# Patient Record
Sex: Female | Born: 1955
Health system: Southern US, Community
[De-identification: ages and names within clinical notes are randomized; demographics above are authoritative.]

## PROBLEM LIST (undated history)

## (undated) DIAGNOSIS — K743 Primary biliary cirrhosis: Secondary | ICD-10-CM

## (undated) DIAGNOSIS — M1990 Unspecified osteoarthritis, unspecified site: Secondary | ICD-10-CM

## (undated) DIAGNOSIS — I10 Essential (primary) hypertension: Secondary | ICD-10-CM

## (undated) DIAGNOSIS — IMO0002 Reserved for concepts with insufficient information to code with codable children: Secondary | ICD-10-CM

## (undated) HISTORY — DX: Unspecified osteoarthritis, unspecified site: M19.90

## (undated) HISTORY — DX: Reserved for concepts with insufficient information to code with codable children: IMO0002

---

## 1976-12-09 HISTORY — PX: DILATION AND CURETTAGE OF UTERUS: SHX78

## 1993-12-09 HISTORY — PX: TUBAL LIGATION: SHX77

## 2000-12-09 HISTORY — PX: OTHER SURGICAL HISTORY: SHX169

## 2001-12-09 DIAGNOSIS — K743 Primary biliary cirrhosis: Secondary | ICD-10-CM

## 2001-12-09 HISTORY — DX: Primary biliary cirrhosis: K74.3

## 2001-12-09 HISTORY — PX: LIVER BIOPSY: SHX301

## 2003-12-10 HISTORY — PX: ENDOMETRIAL FULGURATION: SHX1500

## 2005-04-18 ENCOUNTER — Ambulatory Visit: Payer: Self-pay | Admitting: Internal Medicine

## 2006-04-16 ENCOUNTER — Ambulatory Visit: Payer: Self-pay | Admitting: Internal Medicine

## 2007-04-23 ENCOUNTER — Ambulatory Visit: Payer: Self-pay | Admitting: Internal Medicine

## 2009-02-15 ENCOUNTER — Encounter: Admission: RE | Admit: 2009-02-15 | Discharge: 2009-02-15 | Payer: Self-pay | Admitting: Specialist

## 2011-04-23 NOTE — Assessment & Plan Note (Signed)
[name], [name]                   CHART#:  [id number]   DATE:  04/23/2007                       DOB:  06-09-1956   PRESENTING COMPLAINT:  Follow-up for liver disease secondary to PBC and  NASH.   SUBJECTIVE:  Kathryn Ramos is a 55 year old Caucasian female with biopsy-proven,  AMA positive PBC and NASH, diagnosed in March 2003, who is here for a  yearly visit.  She has no complaints.  She remains with a very good  appetite.  She does try to exercise as often as she is able to.  She  states she has a lot of stress originating from her work.  Sometimes she  works 6 days a week.  She has gained 6 pounds since her last visit.  Her  weight is about the same it was at the time of diagnosis.  She states  she has a very good appetite.  She denies abdominal pain, pruritus,  weakness or fatigue.  Bowels move regularly.  No history of melena or  rectal bleeding.   She had an endometrial polyp removed in January 2001, which was negative  for malignancy.  Endometrial polyp removed in January 2008 which was  benign.   She is on Dyazide 1-2 doses a week, MVI daily, Tylenol p.r.n. but no  more than 2 grams a week, Actigall 600 mg b.i.d., calcium with D 600 mg  b.i.d.   OBJECTIVE:  VITAL SIGNS:  Weight 224-1/2 pounds.  She is 5 feet 6 inches  tall, pulse 88 per minute, blood pressure is 130/80, temperature 98.4.  HEENT:  Conjunctivae pink.  Sclerae are nonicteric.  Oropharyngeal  mucosa is normal.  NECK:  No neck masses are noted.  ABDOMEN:  Obese, soft and nontender.  Spleen is not palpable.  The liver  edge is distant, below RCM.  It does not appear to be enlarged.  EXTREMITIES:  She does not have peripheral edema.   LABS:  From 04/16/2007:  WBC 6.0, H & H is 13.9 and 40.2, platelet count  is 299K.  Bilirubin 0.8, AP 184, AST 43, ALT 49, albumin 3.8.  Her  transaminases on 11/03/2006 were AST of 39 and ALT of 44.   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  Her liver biopsy  was in March 2003 and negative for cirrhosis.  Her transaminases have  gone up slightly in the last 1 year.  I suspect this is related to  weight gain.  She is on 1200 mg of Actigall and there is room for the  dose to be bumped up to 1500.  However, I would like to see what happens  when her numbers are checked in 6 months.   PLAN:  1. She will continue Actigall at 600 mg b.i.d.  2. The patient is encouraged to exercise regularly if possible      everyday.  3. She will have LFTs repeated in 6 months.  4. She will have bone density scheduled through Dr. Marcell Anger office.      Her last study was normal in June 2003.  5. She will return for OV in 1 year.  She will need screening for CRC      in 2012 since her last exam was in 2002.  Hildred Laser, M.D.  Electronically Signed     NR/MEDQ  D:  04/23/2007  T:  04/23/2007  Job:  [id number]   cc:   Carin Primrose

## 2011-05-21 ENCOUNTER — Ambulatory Visit (INDEPENDENT_AMBULATORY_CARE_PROVIDER_SITE_OTHER): Payer: Self-pay | Admitting: Internal Medicine

## 2011-05-28 ENCOUNTER — Ambulatory Visit (INDEPENDENT_AMBULATORY_CARE_PROVIDER_SITE_OTHER): Payer: BC Managed Care – PPO | Admitting: Internal Medicine

## 2011-05-28 DIAGNOSIS — K745 Biliary cirrhosis, unspecified: Secondary | ICD-10-CM

## 2012-01-20 ENCOUNTER — Other Ambulatory Visit (INDEPENDENT_AMBULATORY_CARE_PROVIDER_SITE_OTHER): Payer: Self-pay | Admitting: *Deleted

## 2012-01-20 ENCOUNTER — Telehealth (INDEPENDENT_AMBULATORY_CARE_PROVIDER_SITE_OTHER): Payer: Self-pay | Admitting: *Deleted

## 2012-01-20 DIAGNOSIS — Z1211 Encounter for screening for malignant neoplasm of colon: Secondary | ICD-10-CM

## 2012-01-20 NOTE — Telephone Encounter (Signed)
Patient needs movi prep 

## 2012-01-21 MED ORDER — PEG-KCL-NACL-NASULF-NA ASC-C 100 G PO SOLR: 1.0000 | Freq: Once | ORAL | Status: DC

## 2012-02-25 ENCOUNTER — Telehealth (INDEPENDENT_AMBULATORY_CARE_PROVIDER_SITE_OTHER): Payer: Self-pay | Admitting: *Deleted

## 2012-02-25 NOTE — Telephone Encounter (Signed)
Requesting MD/PCP:  vyas     Name & DOB: Kathryn Ramos 12/20/1955     Procedure: tcs   Reason/Indication:  screening  Has patient had this procedure before?  yes  If so, when, by whom and where?  2002  Is there a family history of colon cancer?  no  Who?  What age when diagnosed?    Is patient diabetic?   no      Does patient have prosthetic heart valve?  no  Do you have a pacemaker?  no  Has patient had joint replacement within last 12 months?  no  Is patient on Coumadin, Plavix and/or Aspirin? yes  Medications: asa 81 mg daily, biotin daily, urso 600 mg bid, acetaminophen 500 mg prn, multi vit, calcium + D 600 mg bid, topamax 75 mg daily, fish oil 1200 mg daily, fiber caplets daily  Allergies: the "cillins"  Pharmacy: [facility]  Medication Adjustment: asa 2 days before  Procedure date & time: 03/18/12 at 930

## 2012-02-27 NOTE — Telephone Encounter (Signed)
agree

## 2012-03-17 MED ORDER — SODIUM CHLORIDE 0.45 % IV SOLN
Freq: Once | INTRAVENOUS | Status: AC
  Administered 2012-03-18: 09:00:00 via INTRAVENOUS

## 2012-03-18 ENCOUNTER — Ambulatory Visit (HOSPITAL_COMMUNITY)
Admission: RE | Admit: 2012-03-18 | Discharge: 2012-03-18 | Disposition: A | Discharge status: Home / Self Care | Payer: BC Managed Care – PPO | Source: Ambulatory Visit | Attending: Internal Medicine | Admitting: Internal Medicine

## 2012-03-18 ENCOUNTER — Encounter (HOSPITAL_COMMUNITY)
Admission: RE | Disposition: A | Discharge status: Home / Self Care | Payer: Self-pay | Source: Ambulatory Visit | Attending: Internal Medicine

## 2012-03-18 ENCOUNTER — Encounter (HOSPITAL_COMMUNITY): Payer: Self-pay | Admitting: *Deleted

## 2012-03-18 DIAGNOSIS — K5730 Diverticulosis of large intestine without perforation or abscess without bleeding: Secondary | ICD-10-CM | POA: Insufficient documentation

## 2012-03-18 DIAGNOSIS — D126 Benign neoplasm of colon, unspecified: Secondary | ICD-10-CM

## 2012-03-18 DIAGNOSIS — Z1211 Encounter for screening for malignant neoplasm of colon: Secondary | ICD-10-CM

## 2012-03-18 HISTORY — DX: Primary biliary cirrhosis: K74.3

## 2012-03-18 HISTORY — PX: COLONOSCOPY: SHX5424

## 2012-03-18 SURGERY — COLONOSCOPY
Anesthesia: Moderate Sedation

## 2012-03-18 MED ORDER — MEPERIDINE HCL 50 MG/ML IJ SOLN
INTRAMUSCULAR | Status: DC | PRN
  Administered 2012-03-18 (×2): 25 mg via INTRAVENOUS

## 2012-03-18 MED ORDER — MIDAZOLAM HCL 5 MG/5ML IJ SOLN
INTRAMUSCULAR | Status: DC | PRN
  Administered 2012-03-18: 2 mg via INTRAVENOUS
  Administered 2012-03-18: 1 mg via INTRAVENOUS
  Administered 2012-03-18: 2 mg via INTRAVENOUS

## 2012-03-18 MED ORDER — MEPERIDINE HCL 50 MG/ML IJ SOLN
INTRAMUSCULAR | Status: AC
  Filled 2012-03-18: qty 1

## 2012-03-18 MED ORDER — MIDAZOLAM HCL 5 MG/5ML IJ SOLN
INTRAMUSCULAR | Status: AC
  Filled 2012-03-18: qty 10

## 2012-03-18 NOTE — Op Note (Signed)
COLONOSCOPY PROCEDURE REPORT  PATIENT:  Kathryn Ramos  MR#:  [id number] Birthdate:  12/16/1955, 56 y.o., female Endoscopist:  Dr. Rogene Houston, MD Referred By:  Dr. Glenda Chroman, MD  Procedure Date: 03/18/2012  Procedure:   Colonoscopy  Indications:  screening for colon cancer. Patient's last exam was in 2002.  Informed Consent:  The procedure and risks were reviewed with the patient and informed consent was obtained.  Medications:  Demerol 50 mg IV Versed 5 mg IV  Description of procedure:  After a digital rectal exam was performed, that colonoscope was advanced from the anus through the rectum and colon to the area of the cecum, ileocecal valve and appendiceal orifice. The cecum was deeply intubated. These structures were well-seen and photographed for the record. From the level of the cecum and ileocecal valve, the scope was slowly and cautiously withdrawn. The mucosal surfaces were carefully surveyed utilizing scope tip to flexion to facilitate fold flattening as needed. The scope was pulled down into the rectum where a thorough exam including retroflexion was performed.  Findings:   Prep excellent. Small polyp ablated via cold biopsy from hepatic flexure. Another smaller polyp ablated via cold biopsy from distal sigmoid colon. Single small diverticulum in sigmoid colon.  Therapeutic/Diagnostic Maneuvers Performed:  See above  Complications:  None  Cecal Withdrawal Time:  13 minutes  Impression:  Examination performed to cecum. Two small polyps ablated via cold biopsy. One from the hepatic flexure and the second one from distal sigmoid colon. Single small diverticulum at sigmoid colon.  Recommendations:  Standard instructions given. I will be contacting patient with biopsy results and further recommendations.  Niylah Hassan U  03/18/2012 11:10 AM  CC: Dr. Glenda Chroman., MD, MD & Dr. Rayne Du ref. provider found

## 2012-03-18 NOTE — Discharge Instructions (Signed)
Rresume usual medications and diet. No driving for 24 hours. Physician will contact you with biopsy results.Colon Polyps A polyp is extra tissue that grows inside your body. Colon polyps grow in the large intestine. The large intestine, also called the colon, is part of your digestive system. It is a long, hollow tube at the end of your digestive tract where your body makes and stores stool. Most polyps are not dangerous. They are benign. This means they are not cancerous. But over time, some types of polyps can turn into cancer. Polyps that are smaller than a pea are usually not harmful. But larger polyps could someday become or may already be cancerous. To be safe, doctors remove all polyps and test them.  WHO GETS POLYPS? Anyone can get polyps, but certain people are more likely than others. You may have a greater chance of getting polyps if:  You are over 50.   You have had polyps before.   Someone in your family has had polyps.   Someone in your family has had cancer of the large intestine.   Find out if someone in your family has had polyps. You may also be more likely to get polyps if you:   Eat a lot of fatty foods.   Smoke.   Drink alcohol.   Do not exercise.   Eat too much.  SYMPTOMS  Most small polyps do not cause symptoms. People often do not know they have one until their caregiver finds it during a regular checkup or while testing them for something else. Some people do have symptoms like these:  Bleeding from the anus. You might notice blood on your underwear or on toilet paper after you have had a bowel movement.   Constipation or diarrhea that lasts more than a week.   Blood in the stool. Blood can make stool look black or it can show up as red streaks in the stool.  If you have any of these symptoms, see your caregiver. HOW DOES THE DOCTOR TEST FOR POLYPS? The doctor can use four tests to check for polyps:  Digital rectal exam. The caregiver wears gloves and  checks your rectum (the last part of the large intestine) to see if it feels normal. This test would find polyps only in the rectum. Your caregiver may need to do one of the other tests listed below to find polyps higher up in the intestine.   Barium enema. The caregiver puts a liquid called barium into your rectum before taking x-rays of your large intestine. Barium makes your intestine look white in the pictures. Polyps are dark, so they are easy to see.   Sigmoidoscopy. With this test, the caregiver can see inside your large intestine. A thin flexible tube is placed into your rectum. The device is called a sigmoidoscope, which has a light and a tiny video camera in it. The caregiver uses the sigmoidoscope to look at the last third of your large intestine.   Colonoscopy. This test is like sigmoidoscopy, but the caregiver looks at all of the large intestine. It usually requires sedation. This is the most common method for finding and removing polyps.  TREATMENT   The caregiver will remove the polyp during sigmoidoscopy or colonoscopy. The polyp is then tested for cancer.   If you have had polyps, your caregiver may want you to get tested regularly in the future.  PREVENTION  There is not one sure way to prevent polyps. You might be able to lower  your risk of getting them if you:  Eat more fruits and vegetables and less fatty food.   Do not smoke.   Avoid alcohol.   Exercise every day.   Lose weight if you are overweight.   Eating more calcium and folate can also lower your risk of getting polyps. Some foods that are rich in calcium are milk, cheese, and broccoli. Some foods that are rich in folate are chickpeas, kidney beans, and spinach.   Aspirin might help prevent polyps. Studies are under way.  Document Released: 08/21/2004 Document Revised: 11/14/2011 Document Reviewed: 01/27/2008 [facility] Patient Information 2012 [place].

## 2012-03-18 NOTE — H&P (Signed)
Kathryn Ramos is an 56 y.o. female.   Chief Complaint: Patient is here for colonoscopy. HPI: Patient is 56 year old Caucasian female who is undergoing average risk screening colonoscopy. His last exam was in 2002. She denies abdominal pain change in bowel habits or rectal bleeding. Family history is negative for CRC.  Past Medical History  Diagnosis Date  . Primary biliary cirrhosis 2003  . Migraine 2009    Past Surgical History  Procedure Date  . Liver biopsy 2003  . Colonscopy 2002  . Tubal ligation 1995  . Dilation and curettage of uterus 1978  . Endometrial fulguration 2005    of a cyst    Family History  Problem Relation Age of Onset  . Dementia Mother   . Cancer Father    Social History:  reports that she has never smoked. She does not have any smokeless tobacco history on file. She reports that she does not drink alcohol or use illicit drugs.  Allergies:  Allergies  Allergen Reactions  . Penicillins Rash    Medications Prior to Admission  Medication Dose Route Frequency Provider Last Rate Last Dose  . 0.45 % sodium chloride infusion   Intravenous Once Rogene Houston, MD 20 mL/hr at 03/18/12 0914     Medications Prior to Admission  Medication Sig Dispense Refill  . aspirin 81 MG tablet Take 81 mg by mouth daily.      . calcium carbonate (OS-CAL) 600 MG TABS Take 600 mg by mouth 2 (two) times daily with a meal.      . fish oil-omega-3 fatty acids 1000 MG capsule Take 2 g by mouth daily.      . Multiple Vitamin (MULTIVITAMIN) capsule Take 1 capsule by mouth daily.      . peg 3350 powder (MOVIPREP) 100 G SOLR Take 1 kit (100 g total) by mouth once.  1 kit  0  . topiramate (TOPAMAX) 25 MG tablet Take 25 mg by mouth once. Takes 3 tabs which = 75mg  daily      . ursodiol (ACTIGALL) 300 MG capsule Take 300 mg by mouth 2 (two) times daily.        No results found for this or any previous visit (from the past 48 hour(s)). No results found.  ROS  Blood pressure  128/76, pulse 79, temperature 98.1 F (36.7 C), temperature source Oral, resp. rate 18, height 5\' 6"  (1.676 m), weight 200 lb (90.719 kg), SpO2 99.00%. Physical Exam   Assessment/Plan Average risk screening colonoscopy  Kathryn Ramos U 03/18/2012, 10:36 AM

## 2012-03-23 ENCOUNTER — Encounter (INDEPENDENT_AMBULATORY_CARE_PROVIDER_SITE_OTHER): Payer: Self-pay | Admitting: *Deleted

## 2012-03-25 ENCOUNTER — Encounter (HOSPITAL_COMMUNITY): Payer: Self-pay | Admitting: Internal Medicine

## 2012-05-21 ENCOUNTER — Encounter (INDEPENDENT_AMBULATORY_CARE_PROVIDER_SITE_OTHER): Payer: Self-pay | Admitting: *Deleted

## 2012-05-25 ENCOUNTER — Telehealth (INDEPENDENT_AMBULATORY_CARE_PROVIDER_SITE_OTHER): Payer: Self-pay | Admitting: *Deleted

## 2012-05-25 NOTE — Telephone Encounter (Signed)
Kathryn Ramos has rescheduled her yearly f/u with Dr. Laural Golden for 07/06/12. She will see her PCP on 06/24/12. Would like to know if you could send her the orders that Dr. Laural Golden may need so she could get blood work done at the same time? Kathryn Ramos return phone number is [phone].

## 2012-06-03 ENCOUNTER — Ambulatory Visit (INDEPENDENT_AMBULATORY_CARE_PROVIDER_SITE_OTHER): Payer: BC Managed Care – PPO | Admitting: Internal Medicine

## 2012-06-05 NOTE — Telephone Encounter (Signed)
Patient called at the number left. She was out of the office. Will call patient back with an answer to labs Dr.Rehman wants done on Monday.

## 2012-06-17 ENCOUNTER — Telehealth (INDEPENDENT_AMBULATORY_CARE_PROVIDER_SITE_OTHER): Payer: Self-pay | Admitting: *Deleted

## 2012-06-17 DIAGNOSIS — K743 Primary biliary cirrhosis: Secondary | ICD-10-CM

## 2012-06-17 NOTE — Telephone Encounter (Signed)
Patient called the office and ask about the lab work that Dr.Rehman wanted her to have, she states that she has an appoinment with her PCP on July 29th and would have them drawn at that this time.

## 2012-07-06 ENCOUNTER — Ambulatory Visit (INDEPENDENT_AMBULATORY_CARE_PROVIDER_SITE_OTHER): Payer: BC Managed Care – PPO | Admitting: Internal Medicine

## 2012-07-06 ENCOUNTER — Encounter (INDEPENDENT_AMBULATORY_CARE_PROVIDER_SITE_OTHER): Payer: Self-pay | Admitting: Internal Medicine

## 2012-07-06 VITALS — BP 120/86 | HR 64 | Temp 98.0°F | Ht 66.0 in | Wt 194.9 lb

## 2012-07-06 DIAGNOSIS — K743 Primary biliary cirrhosis: Secondary | ICD-10-CM

## 2012-07-06 DIAGNOSIS — K745 Biliary cirrhosis, unspecified: Secondary | ICD-10-CM

## 2012-07-06 NOTE — Progress Notes (Signed)
Subjective:     Patient ID: Kathryn Ramos, female   DOB: 09/10/1956, 56 y.o.   MRN: [id number]  HPI Here today for follow up of her PBC. Maintained on Urso. She also has fatty liver.  She was diagnosed in 2003 . Liver biopsy showed stage 11 biopsy. She has no complaints. She is down 2 pounds. She has been trying to loose weight.  She usually has a BM about once a day or day or twice a day.  Coffee helps with her stools.    06/24/2012 Triglycerides 202, ALP 100, AST 32, Total bili 0.4, Dir. Bili less 0.1, Indirect 0.3, ALT 29;. Review of Systems     Objective:   Physical Exam Filed Vitals:   07/06/12 1003  Height: 5\' 6"  (1.676 m)  Weight: 194 lb 14.4 oz (88.406 kg)    Alert and oriented. Skin warm and dry. Oral mucosa is moist.   . Sclera anicteric, conjunctivae is pink. Thyroid not enlarged. No cervical lymphadenopathy. Lungs clear. Heart regular rate and rhythm.  Abdomen is soft. Bowel sounds are positive. No hepatomegaly. No abdominal masses felt. No tenderness.  No edema to lower extremities.      Assessment:    Stage 2 PBC diagnosed in 2003.  She remains asymptomatic. Transaminases are normal. ALP slight up.     Plan:     Continue Uro. Diet and exercise. OV in 1 yr  Talk with Dr.Vyas concerning Bone density.

## 2012-07-06 NOTE — Patient Instructions (Addendum)
Continue Urso. See Dry Vyas concerning bone density. OV in 1 yr.

## 2012-07-08 ENCOUNTER — Telehealth (INDEPENDENT_AMBULATORY_CARE_PROVIDER_SITE_OTHER): Payer: Self-pay | Admitting: *Deleted

## 2012-07-08 NOTE — Telephone Encounter (Signed)
Patient returned your call, please call her at home

## 2012-07-08 NOTE — Telephone Encounter (Signed)
No answer

## 2012-07-08 NOTE — Telephone Encounter (Signed)
Patient seen Monday, has question about some information on her AVS you printed & gave to her, please call @ [phone] before 1

## 2012-07-09 NOTE — Telephone Encounter (Signed)
This has been addressed. OV in 1 yr

## 2012-07-09 NOTE — Telephone Encounter (Signed)
1 yr OV noted in computer

## 2012-09-17 ENCOUNTER — Encounter (INDEPENDENT_AMBULATORY_CARE_PROVIDER_SITE_OTHER): Payer: Self-pay

## 2012-10-26 ENCOUNTER — Encounter (INDEPENDENT_AMBULATORY_CARE_PROVIDER_SITE_OTHER): Payer: Self-pay

## 2013-04-09 ENCOUNTER — Telehealth (INDEPENDENT_AMBULATORY_CARE_PROVIDER_SITE_OTHER): Payer: Self-pay | Admitting: *Deleted

## 2013-04-09 DIAGNOSIS — K743 Primary biliary cirrhosis: Secondary | ICD-10-CM

## 2013-04-09 NOTE — Telephone Encounter (Signed)
Per Dr.Rehman the patient will need to have labs drawn. 

## 2013-04-09 NOTE — Telephone Encounter (Signed)
LM for Kathryn Ramos. She is needing a copy of lab work that is normally done each year to combine with her physical that is coming up soon. Please return the call at [phone].

## 2013-04-14 ENCOUNTER — Other Ambulatory Visit (INDEPENDENT_AMBULATORY_CARE_PROVIDER_SITE_OTHER): Payer: Self-pay | Admitting: Internal Medicine

## 2013-04-14 NOTE — Telephone Encounter (Signed)
Given to [facility]

## 2013-04-28 ENCOUNTER — Encounter (INDEPENDENT_AMBULATORY_CARE_PROVIDER_SITE_OTHER): Payer: Self-pay

## 2013-06-22 ENCOUNTER — Telehealth: Payer: Self-pay | Admitting: Hematology and Oncology

## 2013-06-22 NOTE — Telephone Encounter (Signed)
C/D 06/22/13 for appt.07/15/13

## 2013-06-23 ENCOUNTER — Telehealth: Payer: Self-pay | Admitting: Hematology and Oncology

## 2013-06-23 NOTE — Telephone Encounter (Signed)
PT SCHEDULED TO SEE DR. SALEM 08/07 @ 1:30.  REFERRING DR. Jori Moll GIOFFRE DX- ELECTROPHESIS EVAL WELCOME PACKET MAILED.    REFERRING OFFICE IS AWARE OF NP APPT.

## 2013-07-15 ENCOUNTER — Ambulatory Visit: Payer: BC Managed Care – PPO

## 2013-07-15 ENCOUNTER — Telehealth: Payer: Self-pay | Admitting: *Deleted

## 2013-07-15 ENCOUNTER — Ambulatory Visit (HOSPITAL_BASED_OUTPATIENT_CLINIC_OR_DEPARTMENT_OTHER): Payer: BC Managed Care – PPO | Admitting: Hematology and Oncology

## 2013-07-15 ENCOUNTER — Ambulatory Visit (HOSPITAL_COMMUNITY)
Admission: RE | Admit: 2013-07-15 | Discharge: 2013-07-15 | Disposition: A | Discharge status: Home / Self Care | Payer: BC Managed Care – PPO | Source: Ambulatory Visit | Attending: Hematology and Oncology | Admitting: Hematology and Oncology

## 2013-07-15 ENCOUNTER — Ambulatory Visit (HOSPITAL_BASED_OUTPATIENT_CLINIC_OR_DEPARTMENT_OTHER): Payer: BC Managed Care – PPO | Admitting: Lab

## 2013-07-15 ENCOUNTER — Encounter: Payer: Self-pay | Admitting: Hematology and Oncology

## 2013-07-15 ENCOUNTER — Other Ambulatory Visit (HOSPITAL_BASED_OUTPATIENT_CLINIC_OR_DEPARTMENT_OTHER): Payer: BC Managed Care – PPO | Admitting: Lab

## 2013-07-15 VITALS — BP 154/85 | HR 70 | Temp 98.4°F | Resp 18 | Ht 65.5 in | Wt 202.0 lb

## 2013-07-15 DIAGNOSIS — D890 Polyclonal hypergammaglobulinemia: Secondary | ICD-10-CM

## 2013-07-15 DIAGNOSIS — E8809 Other disorders of plasma-protein metabolism, not elsewhere classified: Secondary | ICD-10-CM | POA: Insufficient documentation

## 2013-07-15 LAB — COMPREHENSIVE METABOLIC PANEL (CC13)
ALT: 30 U/L (ref 0–55)
AST: 30 U/L (ref 5–34)
Albumin: 3.5 g/dL (ref 3.5–5.0)
Alkaline Phosphatase: 101 U/L (ref 40–150)
Calcium: 9.8 mg/dL (ref 8.4–10.4)
Chloride: 110 mEq/L — ABNORMAL HIGH (ref 98–109)
Creatinine: 0.8 mg/dL (ref 0.6–1.1)
Potassium: 3.6 mEq/L (ref 3.5–5.1)

## 2013-07-15 LAB — CBC WITH DIFFERENTIAL/PLATELET
BASO%: 0.4 % (ref 0.0–2.0)
EOS%: 2.3 % (ref 0.0–7.0)
MCH: 30 pg (ref 25.1–34.0)
MCHC: 34 g/dL (ref 31.5–36.0)
MCV: 88.2 fL (ref 79.5–101.0)
MONO%: 7.3 % (ref 0.0–14.0)
RDW: 13.3 % (ref 11.2–14.5)
lymph#: 1.3 10*3/uL (ref 0.9–3.3)

## 2013-07-15 NOTE — Progress Notes (Signed)
[place]  Telephone:([phone]) [phone] Fax:(336) [phone]   MEDICAL ONCOLOGY - INITIAL CONSULATION    Referral MD: Dr. Gladstone Lighter  CC: : Polyclonal Gammopathy   HPI:   We had the pleasure of seeing Kathryn Ramos today in our hematology clinic regarding her abnormal protein gammopathy which was found on SPEP. Kathryn . Kathryn Ramos is a pleasant 57 yrs old female who was seen by Dr. Gladstone Lighter for her right hip pain and had SPEP revealing polyclonal gammopathy. Therefore, patient was referred to [place] for further evalutuion. She is accompained by her Husband. Stated that she feels well, no nausea or vomiting, no fever, chill. She has good energy level, good appetite. Aside from the right hip pain , she has no complains.        Past Medical History  Diagnosis Date  . Primary biliary cirrhosis 2003  . Migraine 2009  :  Past Surgical History  Procedure Laterality Date  . Liver biopsy  2003  . Colonscopy  2002  . Tubal ligation  1995  . Dilation and curettage of uterus  1978  . Endometrial fulguration  2005    of a cyst  . Colonoscopy  03/18/2012    Procedure: COLONOSCOPY;  Surgeon: Rogene Houston, MD;  Location: [facility];  Service: Endoscopy;  Laterality: N/A;  930  :  Current Outpatient Prescriptions  Medication Sig Dispense Refill  . acetaminophen (TYLENOL ARTHRITIS PAIN) 650 MG CR tablet Take 650 mg by mouth every 6 (six) hours as needed for pain.      Marland Kitchen aspirin 81 MG tablet Take 81 mg by mouth daily.      . calcium carbonate (OS-CAL) 600 MG TABS Take 600 mg by mouth 2 (two) times daily with a meal.      . fish oil-omega-3 fatty acids 1000 MG capsule 1,200 mg daily.       . hydrochlorothiazide (MICROZIDE) 12.5 MG capsule Take 12.5 mg by mouth daily as needed.      . Multiple Vitamin (MULTIVITAMIN) capsule Take 1 capsule by mouth daily.      . ursodiol (ACTIGALL) 300 MG capsule Take 600 mg by mouth 2 (two) times daily.       Marland Kitchen topiramate (TOPAMAX) 25 MG  tablet Take 25 mg by mouth once. Takes 3 tabs which = 75mg  daily at bedtime       No current facility-administered medications for this visit.     Allergies  Allergen Reactions  . Indocin (Indomethacin) Swelling and Rash  . Celebrex (Celecoxib)     Loopy messed with stomach - diarrhea  . Meloxicam Other (See Comments)    Loopy/ messed with stomach-diarrhea   . Penicillins Rash  :  Family History  Problem Relation Age of Onset  . Dementia Mother   . Cancer Father   :  History   Social History  . Marital Status: Married    Spouse Name: N/A    Number of Children: N/A  . Years of Education: N/A   Occupational History  . Not on file.   Social History Main Topics  . Smoking status: Never Smoker   . Smokeless tobacco: Not on file  . Alcohol Use: No  . Drug Use: No  . Sexually Active:    Other Topics Concern  . Not on file   Social History Narrative  . No narrative on file  :  Pertinent items are noted in HPI.  Exam: Blood pressure 154/85, pulse 70,  temperature 98.4 F (36.9 C), temperature source Oral, resp. rate 18, height 5' 5.5" (1.664 m), weight 202 lb (91.627 kg).   ECOG PERFORMANCE STATUS: 1 - Symptomatic but completely ambulatory  GENERAL: No distress, well nourished.  SKIN:  No rashes or significant lesions  HEAD: Normocephalic, No masses, lesions, tenderness or abnormalities  EYES: Conjunctiva are pink and non-injected  ENT: External ears normal ,lips , buccal mucosa, and tongue normal and mucous membranes are moist  LYMPH: No palpable lymphadenopathy  BREAST:Normal without mass, skin or nipple changes or axillary nodes,  LUNGS: Clear to auscultation, no crackles or wheezes HEART: Regular rate & rhythm, no murmurs, no gallops, S1 normal and S2 normal  ABDOMEN: Abdomen soft, non-tender, normal bowel sounds, no masses or organomegaly and no hepatosplenomegaly  MSK: No CVA tenderness and no tenderness on percussion of the back or rib  cage. EXTREMITIES: No edema, no skin discoloration or tenderness NEURO: Alert & oriented, no focal motor/sensory deficits.  No results found for this basename: WBC, HGB, HCT, PLT, GLUCOSE, CHOL, TRIG, HDL, LDLDIRECT, LDLCALC, ALT, AST, NA, K, CL, CREATININE, BUN, CO2    Assessment and Plan:   Kathryn Ramos is a pleasant 57 yrs old female who had SPEP done and showed polyclonal gammopathy. Today with patient and her husband we had a long discussion about the finding, reviewed the lab results with them, discussed the diagnostic criteria for multiple myeloma  And the SPEP finding.  We informed Kathryn. Kathryn Ramos that the first step is to repeat her SPEP and measure the light chains, we also will obtain bone survey. She will RTC in 2 weeks to go over the results. We will also informed patient that the results of the lab work that was done is not consistent with multiple myeloma and polyclonal gammopathy could be just reactive.   Patient is in agreement with the plan.   Patient asked a series of questions which was answered hopefully to her staisfication.   Thank you for this referral.  Orie Fisherman, MD 07/15/2013 2:43 PM

## 2013-07-15 NOTE — Progress Notes (Signed)
Checked in new pt with no financial concerns. °

## 2013-07-15 NOTE — Telephone Encounter (Signed)
appts made and printed...td 

## 2013-07-19 LAB — SPEP & IFE WITH QIG
Gamma Globulin: 21.3 % — ABNORMAL HIGH (ref 11.1–18.8)
IgA: 221 mg/dL (ref 69–380)
IgG (Immunoglobin G), Serum: 1670 mg/dL (ref 690–1700)
IgM, Serum: 278 mg/dL (ref 52–322)
Total Protein, Serum Electrophoresis: 7.1 g/dL (ref 6.0–8.3)

## 2013-07-19 LAB — KAPPA/LAMBDA LIGHT CHAINS: Kappa free light chain: 2.55 mg/dL — ABNORMAL HIGH (ref 0.33–1.94)

## 2013-07-19 LAB — BETA 2 MICROGLOBULIN, SERUM: Beta-2 Microglobulin: 2.59 mg/L — ABNORMAL HIGH (ref 1.01–1.73)

## 2013-07-20 ENCOUNTER — Telehealth: Payer: Self-pay | Admitting: Hematology and Oncology

## 2013-07-21 ENCOUNTER — Telehealth: Payer: Self-pay | Admitting: Hematology and Oncology

## 2013-07-21 LAB — UIFE/LIGHT CHAINS/TP QN, 24-HR UR
Albumin, U: DETECTED
Free Kappa Lt Chains,Ur: 0.13 mg/dL — ABNORMAL LOW (ref 0.14–2.42)
Free Lambda Excretion/Day: 0.39 mg/d
Free Lambda Lt Chains,Ur: <0.03 mg/dL (ref 0.02–0.67)
Free Lt Chn Excr Rate: 1.69 mg/d
Time: 24 hours
Volume, Urine: 1300 mL

## 2013-07-21 NOTE — Telephone Encounter (Signed)
, °

## 2013-07-26 ENCOUNTER — Encounter (INDEPENDENT_AMBULATORY_CARE_PROVIDER_SITE_OTHER): Payer: Self-pay | Admitting: *Deleted

## 2013-07-28 ENCOUNTER — Other Ambulatory Visit: Payer: BC Managed Care – PPO | Admitting: Lab

## 2013-07-28 ENCOUNTER — Telehealth: Payer: Self-pay | Admitting: *Deleted

## 2013-07-28 ENCOUNTER — Ambulatory Visit: Payer: BC Managed Care – PPO

## 2013-07-28 NOTE — Telephone Encounter (Signed)
Per Dr Erskine Speed review for visit on 8/21, patient does not need labs for this visit. Attempted to contact patient, left detailed message. Patient called back and confirmed before I could complete this note.

## 2013-07-29 ENCOUNTER — Ambulatory Visit (HOSPITAL_BASED_OUTPATIENT_CLINIC_OR_DEPARTMENT_OTHER): Payer: BC Managed Care – PPO | Admitting: Hematology and Oncology

## 2013-07-29 ENCOUNTER — Other Ambulatory Visit: Payer: BC Managed Care – PPO

## 2013-07-29 ENCOUNTER — Ambulatory Visit: Payer: BC Managed Care – PPO

## 2013-07-29 ENCOUNTER — Other Ambulatory Visit: Payer: BC Managed Care – PPO | Admitting: Lab

## 2013-07-29 VITALS — BP 146/81 | HR 63 | Temp 97.0°F | Resp 18 | Ht 65.0 in | Wt 203.4 lb

## 2013-07-29 DIAGNOSIS — D890 Polyclonal hypergammaglobulinemia: Secondary | ICD-10-CM

## 2013-08-01 NOTE — Progress Notes (Signed)
ID: Leonel Ramsay OB: Jul 06, 1956  MR#: [id number]  [place]  [phone] [phone]   OFFICE PROGRESS NOTE  PCP: Glenda Chroman., MD  DIAGNOSIS: Polyclonal Gammopathy   INTERVAL HISTORY:  Ms.Kathryn Ramos presented today for follow up visit. She was seen in our clinic first time on 07/15/13  regarding her abnormal protein gammopathy which was found on SPEP .Patient has work up for multiple myeloma and presented to discuss results of work up. She feels well.  Aside from the right hip pain , she has no complaints.The patient denied fever, chills, night sweats, change in appetite or weight. She denied headaches, double vision, blurry vision, nasal congestion, nasal discharge, hearing problems, odynophagia or dysphagia. No chest pain, palpitations, dyspnea, cough, abdominal pain, nausea, vomiting, diarrhea, constipation, hematochezia. The patient denied dysuria, nocturia, polyuria, hematuria, myalgia, numbness, tingling, psychiatric problems.  PAST MEDICAL HISTORY: Past Medical History  Diagnosis Date  . Primary biliary cirrhosis 2003  . Migraine 2009    PAST SURGICAL HISTORY: Past Surgical History  Procedure Laterality Date  . Liver biopsy  2003  . Colonscopy  2002  . Tubal ligation  1995  . Dilation and curettage of uterus  1978  . Endometrial fulguration  2005    of a cyst  . Colonoscopy  03/18/2012    Procedure: COLONOSCOPY;  Surgeon: Rogene Houston, MD;  Location: [facility];  Service: Endoscopy;  Laterality: N/A;  930    FAMILY HISTORY Family History  Problem Relation Age of Onset  . Dementia Mother   . Cancer Father     HEALTH MAINTENANCE: History  Substance Use Topics  . Smoking status: Never Smoker   . Smokeless tobacco: Not on file  . Alcohol Use: No    Allergies  Allergen Reactions  . Indocin [Indomethacin] Swelling and Rash  . Celebrex [Celecoxib]     Loopy messed with stomach - diarrhea  . Meloxicam  Other (See Comments)    Loopy/ messed with stomach-diarrhea   . Penicillins Rash    Current Outpatient Prescriptions  Medication Sig Dispense Refill  . acetaminophen (TYLENOL ARTHRITIS PAIN) 650 MG CR tablet Take 650 mg by mouth every 6 (six) hours as needed for pain.      Marland Kitchen aspirin 81 MG tablet Take 81 mg by mouth daily.      . calcium carbonate (OS-CAL) 600 MG TABS Take 600 mg by mouth 2 (two) times daily with a meal.      . fish oil-omega-3 fatty acids 1000 MG capsule 1,200 mg daily.       . hydrochlorothiazide (MICROZIDE) 12.5 MG capsule Take 12.5 mg by mouth daily as needed.      . Multiple Vitamin (MULTIVITAMIN) capsule Take 1 capsule by mouth daily.      Marland Kitchen topiramate (TOPAMAX) 25 MG tablet Take 25 mg by mouth once. Takes 3 tabs which = 75mg  daily at bedtime      . ursodiol (ACTIGALL) 300 MG capsule Take 600 mg by mouth 2 (two) times daily.        No current facility-administered medications for this visit.    OBJECTIVE: Filed Vitals:   07/29/13 0849  BP: 146/81  Pulse: 63  Temp: 97 F (36.1 C)  Resp: 18     Body mass index is 33.85 kg/(m^2).    ECOG FS: 0  PHYSICAL EXAMINATION:  HEENT: Sclerae anicteric.  Conjunctivae were pink. Pupils round and reactive bilaterally. Oral mucosa is moist without ulceration  or thrush. No occipital, submandibular, cervical, supraclavicular or axillar adenopathy. Lungs: clear to auscultation without wheezes. No rales or rhonchi. Heart: regular rate and rhythm. No murmur, gallop or rubs. Abdomen: soft, non tender. No guarding or rebound tenderness. Bowel sounds are present. No palpable hepatosplenomegaly. MSK: no focal spinal tenderness. Extremities: No clubbing or cyanosis.No calf tenderness to palpitation, no peripheral edema. The patient had grossly intact strength in upper and lower extremities Neuro: non-focal, alert and oriented to time, person and place, appropriate affect  LAB RESULTS:  CMP     Component Value Date/Time   NA 141  07/15/2013 1504   K 3.6 07/15/2013 1504   CO2 24 07/15/2013 1504   GLUCOSE 106 07/15/2013 1504   BUN 12.9 07/15/2013 1504   CREATININE 0.8 07/15/2013 1504   CALCIUM 9.8 07/15/2013 1504   PROT 7.6 07/15/2013 1504   ALBUMIN 3.5 07/15/2013 1504   AST 30 07/15/2013 1504   ALT 30 07/15/2013 1504   ALKPHOS 101 07/15/2013 1504   BILITOT 0.33 07/15/2013 1504    Lab Results  Component Value Date   WBC 4.9 07/15/2013   NEUTROABS 3.1 07/15/2013   HGB 13.4 07/15/2013   HCT 39.4 07/15/2013   MCV 88.2 07/15/2013   PLT 165 07/15/2013      Chemistry      Component Value Date/Time   NA 141 07/15/2013 1504   K 3.6 07/15/2013 1504   CO2 24 07/15/2013 1504   BUN 12.9 07/15/2013 1504   CREATININE 0.8 07/15/2013 1504      Component Value Date/Time   CALCIUM 9.8 07/15/2013 1504   ALKPHOS 101 07/15/2013 1504   AST 30 07/15/2013 1504   ALT 30 07/15/2013 1504   BILITOT 0.33 07/15/2013 1504      STUDIES: Dg Bone Survey Met  07/15/2013   *RADIOLOGY REPORT*  Clinical Data: Polyclonal myopathy  METASTATIC BONE SURVEY  Comparison: None.  Findings: No definite lytic myelomatous lesions in the axial or appendicular skeleton.  There are moderate degenerative changes involving entire spine.  IMPRESSION: Negative metastatic bone survey for myelomatous bone lesions.   Original Report Authenticated By: Marijo Sanes, M.D.    ASSESSMENT AND PLAN: 1. There is no evidence on multiple myeloma: No monoclonal protein identified ion serum electrophoresis with immunofixation.. Urine is negative for monoclonal free light chains (Bence Jones protein). Kappa/lambda light chains id normal METASTATIC BONE SURVEY is negative metastatic bone survey for myelomatous bone lesions. Ho anemia, renal failure or hypercalcemia. 2. Patient does not require follow up with [place].  The length of time of the face-to-face encounter was 15 minutes. More than 50% of time was spent counseling and coordination of care.   Conni Slipper, MD   07/29/2013  5:56 PM

## 2013-09-13 ENCOUNTER — Ambulatory Visit (INDEPENDENT_AMBULATORY_CARE_PROVIDER_SITE_OTHER): Payer: BC Managed Care – PPO | Admitting: Internal Medicine

## 2013-09-13 ENCOUNTER — Encounter (INDEPENDENT_AMBULATORY_CARE_PROVIDER_SITE_OTHER): Payer: Self-pay | Admitting: Internal Medicine

## 2013-09-13 ENCOUNTER — Telehealth: Payer: Self-pay

## 2013-09-13 VITALS — BP 134/80 | HR 76 | Temp 97.7°F | Resp 20 | Ht 66.0 in | Wt 201.9 lb

## 2013-09-13 DIAGNOSIS — G4700 Insomnia, unspecified: Secondary | ICD-10-CM | POA: Insufficient documentation

## 2013-09-13 DIAGNOSIS — K745 Biliary cirrhosis, unspecified: Secondary | ICD-10-CM

## 2013-09-13 DIAGNOSIS — E669 Obesity, unspecified: Secondary | ICD-10-CM

## 2013-09-13 DIAGNOSIS — M47816 Spondylosis without myelopathy or radiculopathy, lumbar region: Secondary | ICD-10-CM | POA: Insufficient documentation

## 2013-09-13 DIAGNOSIS — K743 Primary biliary cirrhosis: Secondary | ICD-10-CM

## 2013-09-13 NOTE — Patient Instructions (Signed)
Next blood work will be in 6 months. Upper abdominal ultrasound to be scheduled

## 2013-09-13 NOTE — Telephone Encounter (Signed)
Multiple calls through the day, pt had question about her bill. States the bone survey and Dr Jiles Crocker (radiologist who read it) were billed under "outpatient" and they should have been billed under "diagnostic". She got this information from her own phone calls to [facility]. This information passed to [employer] in financial advocate.

## 2013-09-13 NOTE — Progress Notes (Signed)
Presenting complaint;  Followup for PBC.  Database;  PBC was diagnosed in March 2003 liver biopsy revealing stage II disease.  Subjective;  Patient is 57 year old Caucasian female who is here for yearly visit accompanied by her husband. She does not have GI complaints that she's been having a lot of known GI issues. In July 2012 she sprained her left ankle treated with cast and she felt better. Beginning of last year she developed left calf pain and was evaluated at [place]. MRI suggested for cough muscle. She got relief it compression stockings. She then developed pain in her right leg and found to have DJD of her spine and right hip osteoarthrosis. She was tested for gout leukemia and multiple myeloma. She has copies of the blood work. She was noted to have elevated IgM and gamma globulin and electrophoreses revealed polyclonic gammopathy. She was evaluated by oncologist and felt to have benign gammopathy. While she was undergoing this test in the last year she was treated with meloxicam but developed lower treatment he edema. She was intolerant of Celebrex as well. She has very good appetite. She has gained 8 pounds since her last visit. Her bowels move regularly. She denies melena or rectal bleeding. She is using stationary bike for exercise couple of times a week.  Current Medications: Current Outpatient Prescriptions  Medication Sig Dispense Refill  . aspirin 81 MG tablet Take 81 mg by mouth daily.      . calcium carbonate (OS-CAL) 600 MG TABS Take 600 mg by mouth 2 (two) times daily with a meal.      . doxycycline (VIBRA-TABS) 100 MG tablet Take 100 mg by mouth as needed.       . fish oil-omega-3 fatty acids 1000 MG capsule 1,200 mg daily.       . hydrochlorothiazide (MICROZIDE) 12.5 MG capsule Take 12.5 mg by mouth daily as needed.      . Multiple Vitamin (MULTIVITAMIN) capsule Take 1 capsule by mouth daily.      . NON FORMULARY 400 mg. Turmeric - Patient stats that she  takes 4 a day      . topiramate (TOPAMAX) 25 MG tablet Take 25 mg by mouth once. Takes 3 tabs which = 75mg  daily at bedtime      . ursodiol (ACTIGALL) 300 MG capsule Take 600 mg by mouth 2 (two) times daily.       Marland Kitchen acetaminophen (TYLENOL ARTHRITIS PAIN) 650 MG CR tablet Take 650 mg by mouth every 6 (six) hours as needed for pain.       No current facility-administered medications for this visit.     Objective: Blood pressure 134/80, pulse 76, temperature 97.7 F (36.5 C), temperature source Oral, resp. rate 20, height 5\' 6"  (1.676 m), weight 201 lb 14.4 oz (91.581 kg). Patient is alert and in no acute distress. Conjunctiva is pink. Sclera is nonicteric Oropharyngeal mucosa is normal. No neck masses or thyromegaly noted. Cardiac exam with regular rhythm normal S1 and S2. No murmur or gallop noted. Lungs are clear to auscultation. Abdomen is symmetrical and soft. No tenderness organomegaly or masses noted. No LE edema or clubbing noted.  Labs/studies Results: Labs from 05/12/2013. Serum IgG 1587(normal). Serum IgA 204(normal). Serum IgM 286(40-230) Gamma globulin 1.7(0.5-1.6). Sedimentation rate was 5 C. reactive protein 0.7(0-4.9). CBC from 07/15/2013 within normal limits. LFTs from 07/15/2013. Bili 0.33, AP 101, AST 30, ALT 30, total protein 7.6 and albumin 3.5. Calcium 9.8, creatinine 0.8 and BUN 12.9  Assessment:  #1. PBC. It was diagnosed in March 2003 and was stage II at the time of diagnosis. Her transaminases have remained normal. She does not have stigmata of chronic liver disease or portal hypertension. Suspect polyclonic gammopathy secondary to chronic liver disease. More than two thirds of patient with PBC have elevated serum IgM. Second risk factor for liver diseases obesity. Patient needs to exercise vigorously and trial lose weight.    Plan:  Upper abdominal ultrasound. LFTs in 6 months. Office visit in one year.

## 2013-09-21 ENCOUNTER — Telehealth (INDEPENDENT_AMBULATORY_CARE_PROVIDER_SITE_OTHER): Payer: Self-pay | Admitting: *Deleted

## 2013-09-21 DIAGNOSIS — K743 Primary biliary cirrhosis: Secondary | ICD-10-CM

## 2013-09-21 NOTE — Telephone Encounter (Signed)
Per Dr.Rehman the patient will need to have labs drawn in April 2015.

## 2013-09-23 ENCOUNTER — Ambulatory Visit (HOSPITAL_COMMUNITY)
Admission: RE | Admit: 2013-09-23 | Discharge: 2013-09-23 | Disposition: A | Discharge status: Home / Self Care | Payer: BC Managed Care – PPO | Source: Ambulatory Visit | Attending: Internal Medicine | Admitting: Internal Medicine

## 2013-09-23 DIAGNOSIS — K745 Biliary cirrhosis, unspecified: Secondary | ICD-10-CM | POA: Insufficient documentation

## 2013-09-23 DIAGNOSIS — K743 Primary biliary cirrhosis: Secondary | ICD-10-CM

## 2013-09-30 ENCOUNTER — Telehealth (INDEPENDENT_AMBULATORY_CARE_PROVIDER_SITE_OTHER): Payer: Self-pay | Admitting: *Deleted

## 2013-09-30 DIAGNOSIS — K743 Primary biliary cirrhosis: Secondary | ICD-10-CM

## 2013-09-30 NOTE — Telephone Encounter (Signed)
Per Dr.Rehman the patient will need to have labs drawn in 6 months. 

## 2013-10-05 NOTE — Progress Notes (Signed)
Apt has been scheduled for 04/04/14 at 9:00 am with Dr. Laural Golden.

## 2013-10-05 NOTE — Telephone Encounter (Signed)
Apt has been scheduled for 04/04/14 at 9:00 am with Dr. Laural Golden.

## 2014-03-02 ENCOUNTER — Encounter (INDEPENDENT_AMBULATORY_CARE_PROVIDER_SITE_OTHER): Payer: Self-pay | Admitting: *Deleted

## 2014-03-02 ENCOUNTER — Other Ambulatory Visit (INDEPENDENT_AMBULATORY_CARE_PROVIDER_SITE_OTHER): Payer: Self-pay | Admitting: *Deleted

## 2014-03-02 DIAGNOSIS — K743 Primary biliary cirrhosis: Secondary | ICD-10-CM

## 2014-03-04 ENCOUNTER — Other Ambulatory Visit (INDEPENDENT_AMBULATORY_CARE_PROVIDER_SITE_OTHER): Payer: Self-pay | Admitting: Internal Medicine

## 2014-03-04 MED ORDER — URSODIOL 300 MG PO CAPS: 600.0000 mg | ORAL_CAPSULE | Freq: Two times a day (BID) | ORAL | Status: DC

## 2014-03-07 ENCOUNTER — Other Ambulatory Visit (INDEPENDENT_AMBULATORY_CARE_PROVIDER_SITE_OTHER): Payer: Self-pay | Admitting: Internal Medicine

## 2014-03-07 DIAGNOSIS — K743 Primary biliary cirrhosis: Secondary | ICD-10-CM

## 2014-03-07 MED ORDER — URSODIOL 300 MG PO CAPS: 600.0000 mg | ORAL_CAPSULE | Freq: Two times a day (BID) | ORAL | Status: DC

## 2014-03-07 NOTE — Telephone Encounter (Signed)
Rx sent to her pharmacy 

## 2014-03-14 LAB — CBC WITH DIFFERENTIAL/PLATELET
Basophils Absolute: 0 10*3/uL (ref 0.0–0.1)
Basophils Relative: 0 % (ref 0–1)
EOS PCT: 2 % (ref 0–5)
Eosinophils Absolute: 0.1 10*3/uL (ref 0.0–0.7)
HEMATOCRIT: 40.1 % (ref 36.0–46.0)
HEMOGLOBIN: 13.7 g/dL (ref 12.0–15.0)
LYMPHS PCT: 27 % (ref 12–46)
Lymphs Abs: 1.2 10*3/uL (ref 0.7–4.0)
MCH: 29.5 pg (ref 26.0–34.0)
MCHC: 34.2 g/dL (ref 30.0–36.0)
MCV: 86.4 fL (ref 78.0–100.0)
MONO ABS: 0.5 10*3/uL (ref 0.1–1.0)
MONOS PCT: 10 % (ref 3–12)
Neutro Abs: 2.7 10*3/uL (ref 1.7–7.7)
Neutrophils Relative %: 61 % (ref 43–77)
PLATELETS: 229 10*3/uL (ref 150–400)
RBC: 4.64 MIL/uL (ref 3.87–5.11)
RDW: 13 % (ref 11.5–15.5)
WBC: 4.5 10*3/uL (ref 4.0–10.5)

## 2014-03-15 LAB — COMPREHENSIVE METABOLIC PANEL
ALT: 19 U/L (ref 0–35)
AST: 25 U/L (ref 0–37)
Albumin: 3.9 g/dL (ref 3.5–5.2)
Alkaline Phosphatase: 98 U/L (ref 39–117)
BILIRUBIN TOTAL: 0.5 mg/dL (ref 0.2–1.2)
BUN: 11 mg/dL (ref 6–23)
CALCIUM: 9.6 mg/dL (ref 8.4–10.5)
CHLORIDE: 106 meq/L (ref 96–112)
CO2: 27 meq/L (ref 19–32)
Creat: 0.79 mg/dL (ref 0.50–1.10)
Glucose, Bld: 80 mg/dL (ref 70–99)
Potassium: 3.8 mEq/L (ref 3.5–5.3)
Sodium: 141 mEq/L (ref 135–145)
Total Protein: 7 g/dL (ref 6.0–8.3)

## 2014-03-15 LAB — HEPATIC FUNCTION PANEL
ALT: 22 U/L (ref 0–35)
AST: 26 U/L (ref 0–37)
Albumin: 3.8 g/dL (ref 3.5–5.2)
Alkaline Phosphatase: 99 U/L (ref 39–117)
BILIRUBIN INDIRECT: 0.3 mg/dL (ref 0.2–1.2)
BILIRUBIN TOTAL: 0.4 mg/dL (ref 0.2–1.2)
Bilirubin, Direct: 0.1 mg/dL (ref 0.0–0.3)
Total Protein: 7.1 g/dL (ref 6.0–8.3)

## 2014-04-04 ENCOUNTER — Encounter (INDEPENDENT_AMBULATORY_CARE_PROVIDER_SITE_OTHER): Payer: Self-pay | Admitting: Internal Medicine

## 2014-04-04 ENCOUNTER — Ambulatory Visit (INDEPENDENT_AMBULATORY_CARE_PROVIDER_SITE_OTHER): Payer: BC Managed Care – PPO | Admitting: Internal Medicine

## 2014-04-04 VITALS — BP 130/76 | HR 74 | Temp 97.8°F | Resp 18 | Ht 66.0 in | Wt 201.1 lb

## 2014-04-04 DIAGNOSIS — K745 Biliary cirrhosis, unspecified: Secondary | ICD-10-CM

## 2014-04-04 DIAGNOSIS — K743 Primary biliary cirrhosis: Secondary | ICD-10-CM

## 2014-04-04 MED ORDER — URSODIOL 300 MG PO CAPS: 600.0000 mg | ORAL_CAPSULE | Freq: Two times a day (BID) | ORAL | Status: DC

## 2014-04-04 NOTE — Patient Instructions (Signed)
Please remember to exercise at least 3 times a week for 30 minutes each time

## 2014-04-04 NOTE — Progress Notes (Signed)
Presenting complaint;  Followup for PBC.  Subjective:  Patient is 58 year old Caucasian female who presents for scheduled visit. She was last seen 6 months ago. She has history of stage II PBC which was diagnosed by liver biopsy in March 2003. She has no complaints. She has good appetite. She denies abdominal pain or fatigue. Bowels move regularly. She denies melena or rectal bleeding. She has had problems with allergies and fell about a month ago. She has not been using station bike regularly since then. She states she took allergy medication which made her dizzy and resulting in fall. She denies pruritus. She requests a new prescription for Urso.   Current Medications: Outpatient Encounter Prescriptions as of 04/04/2014  Medication Sig  . acetaminophen (TYLENOL ARTHRITIS PAIN) 650 MG CR tablet Take 650 mg by mouth every 6 (six) hours as needed for pain.  Marland Kitchen aspirin 81 MG tablet Take 81 mg by mouth daily.  . calcium carbonate (OS-CAL) 600 MG TABS Take 600 mg by mouth 2 (two) times daily with a meal.  . doxycycline (VIBRA-TABS) 100 MG tablet Take 100 mg by mouth as needed.   . fish oil-omega-3 fatty acids 1000 MG capsule 1,200 mg daily.   . hydrochlorothiazide (MICROZIDE) 12.5 MG capsule Take 12.5 mg by mouth daily as needed.  . Multiple Vitamin (MULTIVITAMIN) capsule Take 1 capsule by mouth daily.  . NON FORMULARY 400 mg. Turmeric - Patient stats that she takes 2 a day  . topiramate (TOPAMAX) 25 MG tablet Take 25 mg by mouth once. Takes 3 tabs which = 75mg  daily at bedtime  . ursodiol (ACTIGALL) 300 MG capsule Take 2 capsules (600 mg total) by mouth 2 (two) times daily.     Objective: Blood pressure 130/76, pulse 74, temperature 97.8 F (36.6 C), temperature source Oral, resp. rate 18, height 5\' 6"  (1.676 m), weight 201 lb 1.6 oz (91.218 kg). Patient is alert and does not have asterixis. Conjunctiva is pink. Sclera is nonicteric Oropharyngeal mucosa is normal. No neck masses or  thyromegaly noted. Cardiac exam with regular rhythm normal S1 and S2. No murmur or gallop noted. Lungs are clear to auscultation. Abdomen is full but symmetrical soft and nontender without organomegaly or masses.  No LE edema or clubbing noted.  Labs/studies Results:  Ultrasound on 09/23/2013 revealed heterogeneous liver parenchyma and borderline spleen measuring 13.7 cm Lab data from 03/14/2014. WBC 4.5 H&H 13.7 and 40.1 and platelet count 229K Glucose 80. Serum sodium 141, potassium 3.8, chloride 106, CO2 27, BUN 11, creatinine 0.79 Serum calcium 9.6. Bilirubin 0.5, AP 98, AST 25, ALT 19, total protein 7.0 and albumin 3.9.    Assessment:  #1. PBC. She had stage II diagnosed in March 2003. She has no stigmata of chronic liver disease and she has no symptoms of PBC. She possibly also has fatty liver it is very important for her to exercise on regular basis.   Plan:  New prescription for Urso 600 mg by mouth twice a day sent to her pharmacy for 90 days with 3 refills. Office visit in one year.

## 2014-12-07 ENCOUNTER — Encounter (INDEPENDENT_AMBULATORY_CARE_PROVIDER_SITE_OTHER): Payer: Self-pay

## 2014-12-07 ENCOUNTER — Encounter (INDEPENDENT_AMBULATORY_CARE_PROVIDER_SITE_OTHER): Payer: Self-pay | Admitting: *Deleted

## 2015-04-05 ENCOUNTER — Ambulatory Visit (INDEPENDENT_AMBULATORY_CARE_PROVIDER_SITE_OTHER): Payer: BC Managed Care – PPO | Admitting: Internal Medicine

## 2015-04-12 ENCOUNTER — Encounter (INDEPENDENT_AMBULATORY_CARE_PROVIDER_SITE_OTHER): Payer: Self-pay | Admitting: *Deleted

## 2015-04-12 ENCOUNTER — Encounter (INDEPENDENT_AMBULATORY_CARE_PROVIDER_SITE_OTHER): Payer: Self-pay | Admitting: Internal Medicine

## 2015-04-12 ENCOUNTER — Ambulatory Visit (INDEPENDENT_AMBULATORY_CARE_PROVIDER_SITE_OTHER): Payer: BLUE CROSS/BLUE SHIELD | Admitting: Internal Medicine

## 2015-04-12 VITALS — BP 110/76 | HR 66 | Temp 98.4°F | Ht 65.5 in | Wt 217.3 lb

## 2015-04-12 DIAGNOSIS — K743 Primary biliary cirrhosis: Secondary | ICD-10-CM | POA: Diagnosis not present

## 2015-04-12 LAB — HEPATIC FUNCTION PANEL
ALT: 26 U/L (ref 0–35)
AST: 28 U/L (ref 0–37)
Albumin: 3.9 g/dL (ref 3.5–5.2)
Alkaline Phosphatase: 80 U/L (ref 39–117)
Bilirubin, Direct: 0.1 mg/dL (ref 0.0–0.3)
Indirect Bilirubin: 0.3 mg/dL (ref 0.2–1.2)
TOTAL PROTEIN: 7.4 g/dL (ref 6.0–8.3)
Total Bilirubin: 0.4 mg/dL (ref 0.2–1.2)

## 2015-04-12 MED ORDER — URSODIOL 300 MG PO CAPS: 600.0000 mg | ORAL_CAPSULE | Freq: Two times a day (BID) | ORAL | Status: DC

## 2015-04-12 NOTE — Patient Instructions (Signed)
US abdomen with elastrography. Hepatic function. OV in  Year.

## 2015-04-12 NOTE — Progress Notes (Signed)
Subjective:    Patient ID: Kathryn Ramos, female    DOB: [birth year], 59 y.o.   MRN: [id number]  HPI Here for schedule visit. She has biopsy proven PBC. She is maintained on Urso. She also has a fatty liver. She ws diagnosed in March of 2003. Her liver biopsy showed stage 2 disease. She tells me she is doing well.  She says she has gained weight. She has gained 16 pounds since her last visit. BM x 1 a day. No melena or BRRB. No confusion.  She is trying to exercise. She is riding a stationary bike.   No melena or BRRB.  Hepatic Function Latest Ref Rng 03/14/2014 03/14/2014 07/15/2013  Total Protein 6.0 - 8.3 g/dL 7.1 7.0 7.6  Albumin 3.5 - 5.2 g/dL 3.8 3.9 3.5  AST 0 - 37 U/L 26 25 30   ALT 0 - 35 U/L 22 19 30   Alk Phosphatase 39 - 117 U/L 99 98 101  Total Bilirubin 0.2 - 1.2 mg/dL 0.4 0.5 0.33  Bilirubin, Direct 0.0 - 0.3 mg/dL 0.1 - -     09/23/2013 US abdomen: IMPRESSION: Heterogeneous liver parenchyma, nonspecific. Otherwise, normal exam. Review of Systems Married, no children.   Works at [employer]    Past Medical History  Diagnosis Date  . Primary biliary cirrhosis 2003  . Migraine 2009  . Osteoarthritis   . Degeneration, intervertebral disc     Past Surgical History  Procedure Laterality Date  . Liver biopsy  2003  . Colonscopy  2002  . Tubal ligation  1995  . Dilation and curettage of uterus  1978  . Endometrial fulguration  2005    of a cyst  . Colonoscopy  03/18/2012    Procedure: COLONOSCOPY;  Surgeon: Rogene Houston, MD;  Location: [facility];  Service: Endoscopy;  Laterality: N/A;  930    Allergies  Allergen Reactions  . Indocin [Indomethacin] Swelling and Rash  . Celebrex [Celecoxib]     Loopy messed with stomach - diarrhea  . Meloxicam Other (See Comments)    Loopy/ messed with stomach-diarrhea   . Penicillins Rash    Current Outpatient Prescriptions on File Prior to Visit  Medication Sig Dispense Refill  . acetaminophen (TYLENOL ARTHRITIS  PAIN) 650 MG CR tablet Take 650 mg by mouth every 6 (six) hours as needed for pain.    Marland Kitchen aspirin 81 MG tablet Take 81 mg by mouth daily.    . calcium carbonate (OS-CAL) 600 MG TABS Take 600 mg by mouth 2 (two) times daily with a meal.    . doxycycline (VIBRA-TABS) 100 MG tablet Take 100 mg by mouth as needed.     . fish oil-omega-3 fatty acids 1000 MG capsule 1,200 mg daily.     . hydrochlorothiazide (MICROZIDE) 12.5 MG capsule Take 12.5 mg by mouth daily as needed.    . Multiple Vitamin (MULTIVITAMIN) capsule Take 1 capsule by mouth daily.    . NON FORMULARY 400 mg. Turmeric - Patient stats that she takes 2 a day    . topiramate (TOPAMAX) 25 MG tablet Take 25 mg by mouth once. Takes 3 tabs which = 57m daily at bedtime     No current facility-administered medications on file prior to visit.         Objective:   Physical Exam  Blood pressure 110/76, pulse 66, temperature 98.4 F (36.9 C), height 5' 5.5" (1.664 m), weight 217 lb 4.8 oz (98.567 kg).  Alert and oriented. Skin  warm and dry. Oral mucosa is moist.   . Sclera anicteric, conjunctivae is pink. Thyroid not enlarged. No cervical lymphadenopathy. Lungs clear. Heart regular rate and rhythm.  Abdomen is soft. Bowel sounds are positive. No hepatomegaly. No abdominal masses felt. No tenderness.  No edema to lower extremities.        Assessment & Plan:  PBC. She is doing well. She is going to try to lose weight Will schedule a Hepatic function and [place] elastrography OV in 1 yr.

## 2015-04-14 ENCOUNTER — Ambulatory Visit (HOSPITAL_COMMUNITY): Payer: BLUE CROSS/BLUE SHIELD

## 2015-04-18 ENCOUNTER — Ambulatory Visit (HOSPITAL_COMMUNITY)
Admission: RE | Admit: 2015-04-18 | Discharge: 2015-04-18 | Disposition: A | Discharge status: Home / Self Care | Payer: BLUE CROSS/BLUE SHIELD | Source: Ambulatory Visit | Attending: Internal Medicine | Admitting: Internal Medicine

## 2015-04-18 DIAGNOSIS — K743 Primary biliary cirrhosis: Secondary | ICD-10-CM | POA: Diagnosis not present

## 2015-04-20 ENCOUNTER — Telehealth (INDEPENDENT_AMBULATORY_CARE_PROVIDER_SITE_OTHER): Payer: Self-pay | Admitting: Internal Medicine

## 2015-04-20 NOTE — Telephone Encounter (Signed)
No answer. Will call Monday. Unable to leave message.

## 2015-05-04 ENCOUNTER — Encounter (INDEPENDENT_AMBULATORY_CARE_PROVIDER_SITE_OTHER): Payer: Self-pay | Admitting: *Deleted

## 2015-10-23 ENCOUNTER — Encounter (INDEPENDENT_AMBULATORY_CARE_PROVIDER_SITE_OTHER): Payer: Self-pay | Admitting: Internal Medicine

## 2015-10-23 ENCOUNTER — Ambulatory Visit (INDEPENDENT_AMBULATORY_CARE_PROVIDER_SITE_OTHER): Payer: BLUE CROSS/BLUE SHIELD | Admitting: Internal Medicine

## 2015-10-23 VITALS — BP 130/82 | HR 72 | Temp 98.2°F | Ht 66.0 in | Wt 210.8 lb

## 2015-10-23 DIAGNOSIS — K743 Primary biliary cirrhosis: Secondary | ICD-10-CM

## 2015-10-23 LAB — COMPREHENSIVE METABOLIC PANEL
ALT: 29 U/L (ref 6–29)
AST: 30 U/L (ref 10–35)
Albumin: 4 g/dL (ref 3.6–5.1)
Alkaline Phosphatase: 109 U/L (ref 33–130)
BILIRUBIN TOTAL: 0.5 mg/dL (ref 0.2–1.2)
BUN: 14 mg/dL (ref 7–25)
CALCIUM: 10 mg/dL (ref 8.6–10.4)
CO2: 27 mmol/L (ref 20–31)
Chloride: 102 mmol/L (ref 98–110)
Creat: 0.74 mg/dL (ref 0.50–0.99)
GLUCOSE: 89 mg/dL (ref 65–99)
Potassium: 4.1 mmol/L (ref 3.5–5.3)
SODIUM: 139 mmol/L (ref 135–146)
Total Protein: 7.6 g/dL (ref 6.1–8.1)

## 2015-10-23 LAB — CBC WITH DIFFERENTIAL/PLATELET
BASOS ABS: 0 10*3/uL (ref 0.0–0.1)
Basophils Relative: 1 % (ref 0–1)
Eosinophils Absolute: 0.1 10*3/uL (ref 0.0–0.7)
Eosinophils Relative: 3 % (ref 0–5)
HCT: 40.8 % (ref 36.0–46.0)
Hemoglobin: 13.3 g/dL (ref 12.0–15.0)
LYMPHS ABS: 1.1 10*3/uL (ref 0.7–4.0)
Lymphocytes Relative: 28 % (ref 12–46)
MCH: 29.8 pg (ref 26.0–34.0)
MCHC: 32.6 g/dL (ref 30.0–36.0)
MCV: 91.5 fL (ref 78.0–100.0)
MPV: 9.9 fL (ref 8.6–12.4)
Monocytes Absolute: 0.4 10*3/uL (ref 0.1–1.0)
Monocytes Relative: 10 % (ref 3–12)
NEUTROS PCT: 58 % (ref 43–77)
Neutro Abs: 2.4 10*3/uL (ref 1.7–7.7)
PLATELETS: 213 10*3/uL (ref 150–400)
RBC: 4.46 MIL/uL (ref 3.87–5.11)
RDW: 12.8 % (ref 11.5–15.5)
WBC: 4.1 10*3/uL (ref 4.0–10.5)

## 2015-10-23 NOTE — Patient Instructions (Signed)
Labs today. OV in 6 months.  

## 2015-10-23 NOTE — Progress Notes (Signed)
Subjective:    Patient ID: Kathryn Ramos, female    DOB: 01-Feb-1956, 59 y.o.   MRN: [id number]  HPI Here for schedule visit. She has biopsy proven PBC. She is maintained on Urso. She also has a fatty liver. She ws diagnosed in March of 2003. Her liver biopsy showed stage 2 disease. She tells me she is doing well.   BM x 1 a day. No melena or BRRB. No confusion. She has lost about 6 pounds since her last visit.  She is trying to exercise. She is riding a stationary bike but not like she should.  Her last weight was 217 in May of this year. Her next colonoscopy will be due in 2023.  04/18/2015 [place] Elastrography:  IMPRESSION: No evidence of gallstones or biliary dilatation.  Sonographic findings consistent with hepatic cirrhosis. No liver mass visualized sonographically. No evidence of ascites or splenomegaly.  Median hepatic shear wave velocity is calculated at 3.84 m/sec.  Corresponding Metavir fibrosis score is some F3 +F4.  Risk of fibrosis is high.     CBC Latest Ref Rng 03/14/2014 07/15/2013  WBC 4.0 - 10.5 K/uL 4.5 4.9  Hemoglobin 12.0 - 15.0 g/dL 13.7 13.4  Hematocrit 36.0 - 46.0 % 40.1 39.4  Platelets 150 - 400 K/uL 229 165    CMP Latest Ref Rng 04/12/2015 03/14/2014 03/14/2014  Glucose 70 - 99 mg/dL - - 80  BUN 6 - 23 mg/dL - - 11  Creatinine 0.50 - 1.10 mg/dL - - 0.79  Sodium 135 - 145 mEq/L - - 141  Potassium 3.5 - 5.3 mEq/L - - 3.8  Chloride 96 - 112 mEq/L - - 106  CO2 19 - 32 mEq/L - - 27  Calcium 8.4 - 10.5 mg/dL - - 9.6  Total Protein 6.0 - 8.3 g/dL 7.4 7.1 7.0  Total Bilirubin 0.2 - 1.2 mg/dL 0.4 0.4 0.5  Alkaline Phos 39 - 117 U/L 80 99 98  AST 0 - 37 U/L 28 26 25   ALT 0 - 35 U/L 26 22 19           Review of Systems     Past Medical History  Diagnosis Date  . Primary biliary cirrhosis ([place]) 2003  . Migraine 2009  . Osteoarthritis   . Degeneration, intervertebral disc     Past Surgical History  Procedure Laterality Date  . Liver biopsy   2003  . Colonscopy  2002  . Tubal ligation  1995  . Dilation and curettage of uterus  1978  . Endometrial fulguration  2005    of a cyst  . Colonoscopy  03/18/2012    Procedure: COLONOSCOPY;  Surgeon: Rogene Houston, MD;  Location: [facility];  Service: Endoscopy;  Laterality: N/A;  930    Allergies  Allergen Reactions  . Indocin [Indomethacin] Swelling and Rash  . Celebrex [Celecoxib]     Loopy messed with stomach - diarrhea  . Meloxicam Other (See Comments)    Loopy/ messed with stomach-diarrhea   . Penicillins Rash    Current Outpatient Prescriptions on File Prior to Visit  Medication Sig Dispense Refill  . acetaminophen (TYLENOL ARTHRITIS PAIN) 650 MG CR tablet Take 650 mg by mouth every 6 (six) hours as needed for pain.    Marland Kitchen acetaminophen (TYLENOL) 325 MG tablet Take 650 mg by mouth every 6 (six) hours as needed.    Marland Kitchen aspirin 81 MG tablet Take 81 mg by mouth daily.    . calcium carbonate (OS-CAL)  600 MG TABS Take 600 mg by mouth 2 (two) times daily with a meal.    . doxycycline (VIBRA-TABS) 100 MG tablet Take 100 mg by mouth as needed.     . fish oil-omega-3 fatty acids 1000 MG capsule 1,200 mg daily.     . hydrochlorothiazide (MICROZIDE) 12.5 MG capsule Take 12.5 mg by mouth daily as needed.    . Multiple Vitamin (MULTIVITAMIN) capsule Take 1 capsule by mouth daily.    . NON FORMULARY 400 mg. Turmeric - Patient stats that she takes 1 a day    . ursodiol (ACTIGALL) 300 MG capsule Take 2 capsules (600 mg total) by mouth 2 (two) times daily. 360 capsule 4  . topiramate (TOPAMAX) 25 MG tablet Take 25 mg by mouth once. Takes 3 tabs which = 75mg  daily at bedtime     No current facility-administered medications on file prior to visit.     Objective:   Physical Exam  Blood pressure 130/82, pulse 72, temperature 98.2 F (36.8 C), height 5\' 6"  (1.676 m), weight 210 lb 12.8 oz (95.618 kg).   Alert and oriented. Skin warm and dry. Oral mucosa is moist.   . Sclera anicteric,  conjunctivae is pink. Thyroid not enlarged. No cervical lymphadenopathy. Lungs clear. Heart regular rate and rhythm.  Abdomen is soft. Bowel sounds are positive. No hepatomegaly. No abdominal masses felt. No tenderness.  No edema to lower extremities.         Assessment & Plan:  PBC. She is doing well. Normal liver function. Continue to diet and exercise. CBC and Cmet today.  OV in 6 months.

## 2015-10-25 ENCOUNTER — Encounter (INDEPENDENT_AMBULATORY_CARE_PROVIDER_SITE_OTHER): Payer: Self-pay

## 2016-03-07 ENCOUNTER — Encounter (INDEPENDENT_AMBULATORY_CARE_PROVIDER_SITE_OTHER): Payer: Self-pay | Admitting: Internal Medicine

## 2016-06-18 ENCOUNTER — Ambulatory Visit (INDEPENDENT_AMBULATORY_CARE_PROVIDER_SITE_OTHER): Payer: BLUE CROSS/BLUE SHIELD | Admitting: Internal Medicine

## 2016-07-23 ENCOUNTER — Ambulatory Visit (INDEPENDENT_AMBULATORY_CARE_PROVIDER_SITE_OTHER): Payer: BLUE CROSS/BLUE SHIELD | Admitting: Internal Medicine

## 2016-09-24 ENCOUNTER — Ambulatory Visit (INDEPENDENT_AMBULATORY_CARE_PROVIDER_SITE_OTHER): Payer: BLUE CROSS/BLUE SHIELD | Admitting: Internal Medicine

## 2016-09-24 ENCOUNTER — Encounter (INDEPENDENT_AMBULATORY_CARE_PROVIDER_SITE_OTHER): Payer: Self-pay | Admitting: Internal Medicine

## 2016-09-24 VITALS — BP 140/82 | HR 68 | Temp 97.8°F | Resp 18 | Ht 66.0 in | Wt 217.1 lb

## 2016-09-24 DIAGNOSIS — M85851 Other specified disorders of bone density and structure, right thigh: Secondary | ICD-10-CM | POA: Diagnosis not present

## 2016-09-24 DIAGNOSIS — K743 Primary biliary cirrhosis: Secondary | ICD-10-CM | POA: Diagnosis not present

## 2016-09-24 DIAGNOSIS — M85852 Other specified disorders of bone density and structure, left thigh: Secondary | ICD-10-CM

## 2016-09-24 NOTE — Patient Instructions (Signed)
Remember to exercise/walking at least 4 times a week if not more. Please check with Dr. Woody Seller about getting hepatitis A and B vaccination unless this has already been done. Next blood work in 2 months.

## 2016-09-24 NOTE — Progress Notes (Signed)
Presenting complaint;  Follow-up for PBC.  Subjective:  Kathryn Ramos is 60 year old Caucasian female was history of primary biliary cholangitis and is here for scheduled visit. She was last seen in November 2016. She is accompanied by her husband. She has no complaints. She has gained 7 pounds since her last visit. She feels weight gain is result of recent vacation. So admits to have slacked up on her scheduled physical activity. Presently she is riding stationary bike about 3 times a week. She is not walking as much as she was doing before. She has very good appetite. She denies pruritus abdominal pain melena or rectal bleeding. He brings along a copy of her blood work from 05/17/2016. She was noted to have mildly elevated serum calcium she was advised to cut back on Os-Cal dose to one a day. She takes Urso as prescribed.   Current Medications: Outpatient Encounter Prescriptions as of 09/24/2016  Medication Sig  . acetaminophen (TYLENOL ARTHRITIS PAIN) 650 MG CR tablet Take 650 mg by mouth every 6 (six) hours as needed for pain.  Marland Kitchen acetaminophen (TYLENOL) 325 MG tablet Take 650 mg by mouth every 6 (six) hours as needed.  Marland Kitchen aspirin 81 MG tablet Take 81 mg by mouth daily.  . calcium carbonate (OS-CAL) 600 MG TABS Take 600 mg by mouth daily with breakfast.   . doxycycline (VIBRA-TABS) 100 MG tablet Take 100 mg by mouth as needed.   . fish oil-omega-3 fatty acids 1000 MG capsule 1,200 mg daily.   . hydrochlorothiazide (MICROZIDE) 12.5 MG capsule Take 12.5 mg by mouth daily as needed.  . Multiple Vitamin (MULTIVITAMIN) capsule Take 1 capsule by mouth daily.  . NON FORMULARY 900 mg daily. Turmeric - Patient stats that she takes 1 a day  . topiramate (TOPAMAX) 25 MG tablet Take 25 mg by mouth as needed. Takes 3 tabs which = 75mg  daily at bedtime  . ursodiol (ACTIGALL) 300 MG capsule Take 2 capsules (600 mg total) by mouth 2 (two) times daily.   No facility-administered encounter medications on file as  of 09/24/2016.      Objective: Blood pressure 140/82, pulse 68, temperature 97.8 F (36.6 C), temperature source Oral, resp. rate 18, height 5\' 6"  (1.676 m), weight 217 lb 1.6 oz (98.5 kg). Patient is alert and in no acute distress. She does not have asterixis. Conjunctiva is pink. Sclera is nonicteric Oropharyngeal mucosa is normal. No neck masses or thyromegaly noted. Cardiac exam with regular rhythm normal S1 and S2. No murmur or gallop noted. Lungs are clear to auscultation. Abdomen is full but soft and nontender without organomegaly or masses. Liver edge is indistinct. Liver does not appear to be enlarged by percussion. No LE edema or clubbing noted.  Labs/studies Results: Lab data from 05/18/2016. WBC 3.8 H/H 14.7/ 44.5 Platelet count  196 K.  Bili 0.4, AP 113, AST 38, ALT 35, Alb. 4.4 Ca. 10.5 Glu. 105 BUN 11, Cr. 0.79  [place] with elastography on 04/18/2015  Oh evidence of cholelithiasis. Liver was echogenic with subtle capsular not T suggestive of cirrhosis. Spleen was within normal limits.  Elastography revealed IQR/median velocity ratio of 0.73 consistent with fibrosis score between F3 and F4.   Assessment:  #1. PBC diagnosed in 2003. Elastography in May last year suggested F3 or F4 disease. She does not have stgimata of chronic liver disease. Her platelet count is normal and she does not have splenomegaly. Therefore if she has cirrhosis it has to be early. Transaminases up slightly which may  be due fatty liver disease.She has slacked up on physical activity. Plan to repeatelastography next year. #2. Osteopenia on bone density of November 2016. #3. Borderline serum calcium. This will be repeated with LFTs in 2 months. Mildly elevated serum calcium may be due to HCTZ. She is being monitored.  Plan:  Hep A and B vaccination unless she has received vaccination previously.  CMET in 2 months. Patient advised that she must exercise and walk at least 4 times a week. As it  lay activity will slow down bone loss and also help with fatty liver. She needs to gradually lose weight. OV in 6 months.

## 2016-09-26 ENCOUNTER — Encounter (INDEPENDENT_AMBULATORY_CARE_PROVIDER_SITE_OTHER): Payer: Self-pay

## 2016-12-04 ENCOUNTER — Other Ambulatory Visit (INDEPENDENT_AMBULATORY_CARE_PROVIDER_SITE_OTHER): Payer: Self-pay | Admitting: *Deleted

## 2016-12-04 ENCOUNTER — Encounter (INDEPENDENT_AMBULATORY_CARE_PROVIDER_SITE_OTHER): Payer: Self-pay | Admitting: *Deleted

## 2016-12-04 DIAGNOSIS — K743 Primary biliary cirrhosis: Secondary | ICD-10-CM

## 2017-01-16 LAB — COMPREHENSIVE METABOLIC PANEL
ALT: 21 U/L (ref 6–29)
AST: 24 U/L (ref 10–35)
Albumin: 4.1 g/dL (ref 3.6–5.1)
Alkaline Phosphatase: 97 U/L (ref 33–130)
BUN: 16 mg/dL (ref 7–25)
CALCIUM: 9.6 mg/dL (ref 8.6–10.4)
CO2: 30 mmol/L (ref 20–31)
Chloride: 101 mmol/L (ref 98–110)
Creat: 0.77 mg/dL (ref 0.50–0.99)
GLUCOSE: 131 mg/dL — AB (ref 65–99)
Potassium: 4 mmol/L (ref 3.5–5.3)
Sodium: 137 mmol/L (ref 135–146)
Total Bilirubin: 0.3 mg/dL (ref 0.2–1.2)
Total Protein: 7.3 g/dL (ref 6.1–8.1)

## 2017-01-20 ENCOUNTER — Encounter (INDEPENDENT_AMBULATORY_CARE_PROVIDER_SITE_OTHER): Payer: Self-pay | Admitting: Internal Medicine

## 2017-01-20 NOTE — Progress Notes (Signed)
Patient's April appointment was cancelled and she was given a new appointment for 09/23/17 at 9:45am.  I left a message on the patient's home phone and mailed her a letter.

## 2017-02-06 ENCOUNTER — Encounter (INDEPENDENT_AMBULATORY_CARE_PROVIDER_SITE_OTHER): Payer: Self-pay

## 2017-03-25 ENCOUNTER — Ambulatory Visit (INDEPENDENT_AMBULATORY_CARE_PROVIDER_SITE_OTHER): Payer: BLUE CROSS/BLUE SHIELD | Admitting: Internal Medicine

## 2017-09-23 ENCOUNTER — Ambulatory Visit (INDEPENDENT_AMBULATORY_CARE_PROVIDER_SITE_OTHER): Payer: BLUE CROSS/BLUE SHIELD | Admitting: Internal Medicine

## 2018-01-27 ENCOUNTER — Ambulatory Visit (INDEPENDENT_AMBULATORY_CARE_PROVIDER_SITE_OTHER): Payer: BLUE CROSS/BLUE SHIELD | Admitting: Internal Medicine

## 2018-01-27 ENCOUNTER — Encounter (INDEPENDENT_AMBULATORY_CARE_PROVIDER_SITE_OTHER): Payer: Self-pay | Admitting: Internal Medicine

## 2018-01-27 VITALS — BP 130/76 | HR 68 | Temp 98.1°F | Resp 18 | Ht 66.0 in | Wt 229.0 lb

## 2018-01-27 DIAGNOSIS — K743 Primary biliary cirrhosis: Secondary | ICD-10-CM

## 2018-01-27 DIAGNOSIS — K760 Fatty (change of) liver, not elsewhere classified: Secondary | ICD-10-CM

## 2018-01-27 NOTE — Progress Notes (Signed)
Presenting complaint;  Follow-up for chronic liver disease.  Subjective:  Patient is 62 year old Caucasian female who has biopsy-proven primary biliary cholangitis and history of fatty liver who is here for scheduled visit.  She was last seen in October 2017. She denies abdominal pain pruritus nausea or vomiting.  Bowels move every day.  She denies melena or rectal bleeding.  She has heartburn once or twice a week usually in the evening.  She states she has not been walking or exercising on account of lower back pain.  She had an MRI last month.  She states she had blood work at the time of physical examination in June 2018 and she was told it was normal.  She had bone density study in 2017 and was noted to be osteopenic.  She has gained 12 pounds since her last visit about 17 months ago. She is taking Duexis every 8 hours.  She says it is short-term only.    Current Medications: Outpatient Encounter Medications as of 01/27/2018  Medication Sig  . acetaminophen (TYLENOL) 500 MG tablet Take 650 mg by mouth as needed.   Marland Kitchen aspirin 81 MG tablet Take 81 mg by mouth daily.  . calcium carbonate (OS-CAL) 600 MG TABS Take 600 mg by mouth daily with breakfast.   . doxycycline (VIBRA-TABS) 100 MG tablet Take 100 mg by mouth as needed.   . DUEXIS 800-26.6 MG TABS Take 1 tablet by mouth every 8 (eight) hours.  . fish oil-omega-3 fatty acids 1000 MG capsule 1,200 mg daily.   Marland Kitchen lisinopril (PRINIVIL,ZESTRIL) 10 MG tablet Take 10 mg by mouth daily.  . Multiple Vitamin (MULTIVITAMIN) capsule Take 1 capsule by mouth daily.  . NON FORMULARY 500 mg daily. Turmeric - Patient stats that she takes 1 a day  . ursodiol (ACTIGALL) 300 MG capsule Take 2 capsules (600 mg total) by mouth 2 (two) times daily.  . [DISCONTINUED] acetaminophen (TYLENOL ARTHRITIS PAIN) 650 MG CR tablet Take 650 mg by mouth every 6 (six) hours as needed for pain.  . [DISCONTINUED] hydrochlorothiazide (MICROZIDE) 12.5 MG capsule Take 12.5 mg by  mouth daily as needed.  . [DISCONTINUED] topiramate (TOPAMAX) 25 MG tablet Take 25 mg by mouth as needed. Takes 3 tabs which = 75mg  daily at bedtime   No facility-administered encounter medications on file as of 01/27/2018.      Objective: Blood pressure 130/76, pulse 68, temperature 98.1 F (36.7 C), temperature source Oral, resp. rate 18, height 5\' 6"  (1.676 m), weight 229 lb (103.9 kg). Patient is alert and in no acute distress Conjunctiva is pink. Sclera is nonicteric Oropharyngeal mucosa is normal. No neck masses or thyromegaly noted. Cardiac exam with regular rhythm normal S1 and S2. No murmur or gallop noted. Lungs are clear to auscultation. Abdomen is full.  It is soft and nontender with organomegaly or masses. No LE edema or clubbing noted.  Labs/studies Results: Ultrasound with elastography in May 2016. Liver contour consistent with hepatic cirrhosis. Fibrosis score F3 and F4.  LFTs from 01/15/2017 Bilirubin 0.3, AP 97, AST 24, ALT 21 and albumin 4.1.  Assessment:  #1.  Primary biliary cholangitis.  She was diagnosed in March 2003 and her disease was stage II.  Based on imaging studies of May 2016 she has F3/F4 disease.  She remains with preserved hepatic function.  She also has had fatty liver which is contributing to her disease and weight gain and sedentary lifestyle making things worse for her.   Plan:  Will request copy  of recent blood work from PCPs office. Patient can take Pepcid OTC 20 mg nightly as needed for heartburn. Once again patient advised to increase physical activity.  She may consider water aerobics since she is not able to walk for long because of back pain. Office visit in 1 year.

## 2018-01-27 NOTE — Patient Instructions (Addendum)
Regular exercise as tolerated; consider water aerobics if walking not feasible because of back pain. Will request copy of blood work from PCPs office. Can take Pepcid OTC 20 mg daily as needed for heartburn.

## 2018-05-05 ENCOUNTER — Telehealth (INDEPENDENT_AMBULATORY_CARE_PROVIDER_SITE_OTHER): Payer: Self-pay | Admitting: Internal Medicine

## 2018-05-05 ENCOUNTER — Other Ambulatory Visit (INDEPENDENT_AMBULATORY_CARE_PROVIDER_SITE_OTHER): Payer: Self-pay | Admitting: *Deleted

## 2018-05-05 DIAGNOSIS — K743 Primary biliary cirrhosis: Secondary | ICD-10-CM

## 2018-05-05 MED ORDER — URSODIOL 300 MG PO CAPS: 600.0000 mg | ORAL_CAPSULE | Freq: Two times a day (BID) | ORAL | 4 refills | Status: DC

## 2018-05-05 NOTE — Telephone Encounter (Signed)
A refill was sent to [facility]/ [place].

## 2018-05-05 NOTE — Telephone Encounter (Signed)
Patient called needs refill on Ursodiol - if need to call her at [phone]

## 2019-03-09 ENCOUNTER — Ambulatory Visit (INDEPENDENT_AMBULATORY_CARE_PROVIDER_SITE_OTHER): Payer: BLUE CROSS/BLUE SHIELD | Admitting: Internal Medicine

## 2019-03-16 ENCOUNTER — Ambulatory Visit (INDEPENDENT_AMBULATORY_CARE_PROVIDER_SITE_OTHER): Payer: BLUE CROSS/BLUE SHIELD | Admitting: Internal Medicine

## 2019-03-23 ENCOUNTER — Ambulatory Visit (INDEPENDENT_AMBULATORY_CARE_PROVIDER_SITE_OTHER): Payer: BLUE CROSS/BLUE SHIELD | Admitting: Internal Medicine

## 2019-06-14 ENCOUNTER — Ambulatory Visit (INDEPENDENT_AMBULATORY_CARE_PROVIDER_SITE_OTHER): Payer: BLUE CROSS/BLUE SHIELD | Admitting: Internal Medicine

## 2019-08-03 DIAGNOSIS — Z7189 Other specified counseling: Secondary | ICD-10-CM | POA: Diagnosis not present

## 2019-08-03 DIAGNOSIS — Z79899 Other long term (current) drug therapy: Secondary | ICD-10-CM | POA: Diagnosis not present

## 2019-08-03 DIAGNOSIS — Z1331 Encounter for screening for depression: Secondary | ICD-10-CM | POA: Diagnosis not present

## 2019-08-03 DIAGNOSIS — I10 Essential (primary) hypertension: Secondary | ICD-10-CM | POA: Diagnosis not present

## 2019-08-03 DIAGNOSIS — Z1339 Encounter for screening examination for other mental health and behavioral disorders: Secondary | ICD-10-CM | POA: Diagnosis not present

## 2019-08-03 DIAGNOSIS — Z6836 Body mass index (BMI) 36.0-36.9, adult: Secondary | ICD-10-CM | POA: Diagnosis not present

## 2019-08-03 DIAGNOSIS — Z0000 Encounter for general adult medical examination without abnormal findings: Secondary | ICD-10-CM | POA: Diagnosis not present

## 2019-08-03 DIAGNOSIS — E2839 Other primary ovarian failure: Secondary | ICD-10-CM | POA: Diagnosis not present

## 2019-08-03 DIAGNOSIS — R5383 Other fatigue: Secondary | ICD-10-CM | POA: Diagnosis not present

## 2019-08-03 DIAGNOSIS — Z1211 Encounter for screening for malignant neoplasm of colon: Secondary | ICD-10-CM | POA: Diagnosis not present

## 2019-08-03 DIAGNOSIS — Z299 Encounter for prophylactic measures, unspecified: Secondary | ICD-10-CM | POA: Diagnosis not present

## 2019-08-04 DIAGNOSIS — L570 Actinic keratosis: Secondary | ICD-10-CM | POA: Diagnosis not present

## 2019-08-04 DIAGNOSIS — D1801 Hemangioma of skin and subcutaneous tissue: Secondary | ICD-10-CM | POA: Diagnosis not present

## 2019-08-04 DIAGNOSIS — L719 Rosacea, unspecified: Secondary | ICD-10-CM | POA: Diagnosis not present

## 2019-08-04 DIAGNOSIS — D235 Other benign neoplasm of skin of trunk: Secondary | ICD-10-CM | POA: Diagnosis not present

## 2019-08-10 ENCOUNTER — Ambulatory Visit (INDEPENDENT_AMBULATORY_CARE_PROVIDER_SITE_OTHER): Payer: Medicare HMO | Admitting: Internal Medicine

## 2019-08-10 ENCOUNTER — Encounter (INDEPENDENT_AMBULATORY_CARE_PROVIDER_SITE_OTHER): Payer: Self-pay | Admitting: *Deleted

## 2019-08-10 ENCOUNTER — Encounter (INDEPENDENT_AMBULATORY_CARE_PROVIDER_SITE_OTHER): Payer: Self-pay | Admitting: Internal Medicine

## 2019-08-10 ENCOUNTER — Other Ambulatory Visit: Payer: Self-pay

## 2019-08-10 VITALS — BP 130/80 | HR 71 | Temp 98.2°F | Ht 66.0 in | Wt 215.3 lb

## 2019-08-10 DIAGNOSIS — K743 Primary biliary cirrhosis: Secondary | ICD-10-CM

## 2019-08-10 NOTE — Progress Notes (Signed)
Presenting complaint;  Follow-up for primary biliary cholangitis.  Database and subjective:  Kathryn Ramos is 63 year old Caucasian female who has history of primary biliary cholangitis which was diagnosed in March 2003. Liver biopsy revealed stage II disease.  She has been maintained on Urso.  She had mildly elevated transaminases and alkaline phosphatase which have normalized. Last ultrasound was in May 2016 revealing heterogeneous liver and question of subtle capsular nodularity suggestive of cirrhosis.  Her spleen was normal.  Elastography revealed IQR/median velocity ratio of 0.273 corresponding to fibrosis score of F3 and F4. I felt elastography was over estimating her disease.  She has no complaints.  She has lost 24 pounds since her last visit of February 2019.  She says she is been watching her calorie intake and she is also doing daily exercise.  She has recumbent bike that she uses every morning.  She has chronic lower back pain as well as right hip pain due to osteoarthrosis and cannot report for long.  She has no difficulty when she uses recumbent bike.  She denies abdominal pain pruritus nausea vomiting heartburn dysphagia bowels move daily.  She denies melena or rectal bleeding. She is on Vibra-Tabs for rosacea.  She has had more rash that she has been using facemask. Her last colonoscopy was in April 2013 with removal of 2 small polyps only one was an adenoma.  It was decided to do next colonoscopy in April 2023.  She brings along copy of her blood work.  Current Medications: Outpatient Encounter Medications as of 08/10/2019  Medication Sig  . acetaminophen (TYLENOL) 500 MG tablet Take 650 mg by mouth as needed.   Marland Kitchen amLODipine (NORVASC) 5 MG tablet Take by mouth daily.   Marland Kitchen aspirin 81 MG tablet Take 81 mg by mouth daily.  . Cholecalciferol (VITAMIN D3 PO) Take 2,000 Units by mouth daily.  Marland Kitchen doxycycline (VIBRA-TABS) 100 MG tablet Take 100 mg by mouth as needed.   . DUEXIS 800-26.6 MG  TABS Take 1 tablet by mouth every 8 (eight) hours.  . fish oil-omega-3 fatty acids 1000 MG capsule 1,200 mg daily.   . hydrochlorothiazide (HYDRODIURIL) 12.5 MG tablet Take 12.5 mg by mouth daily.  . Multiple Vitamin (MULTIVITAMIN) capsule Take 1 capsule by mouth daily.  . NON FORMULARY 500 mg daily. Turmeric - Patient stats that she takes 1 a day  . ursodiol (ACTIGALL) 300 MG capsule Take 2 capsules (600 mg total) by mouth 2 (two) times daily.  . [DISCONTINUED] calcium carbonate (OS-CAL) 600 MG TABS Take 600 mg by mouth daily with breakfast.   . [DISCONTINUED] lisinopril (PRINIVIL,ZESTRIL) 10 MG tablet Take 10 mg by mouth daily.   No facility-administered encounter medications on file as of 08/10/2019.      Objective: Blood pressure 130/80, pulse 71, temperature 98.2 F (36.8 C), temperature source Oral, height 5\' 6"  (1.676 m), weight 215 lb 4.8 oz (97.7 kg). Patient is alert and in no acute distress. She does not have asterixis. She is wearing a facemask. Conjunctiva is pink. Sclera is nonicteric Oropharyngeal mucosa is normal. No neck masses or thyromegaly noted. Cardiac exam with regular rhythm normal S1 and S2. No murmur or gallop noted. Lungs are clear to auscultation. Abdomen abdomen is obese but soft and nontender without organomegaly or masses. No LE edema or clubbing noted.  Labs/studies Results: Lab data from 08/03/2019 WBC 3.8, H&H 13.6 and 41.3 and platelet count 180 9K. Glucose 104 BUN 11 and creatinine 0.90 Serum sodium 141, potassium 4.2, chloride 102,  CO2 27 Serum calcium 10. Bilirubin 0.5, AP 147(39-117) AST 39, ALT 32, total protein 8.1 and albumin 4.1 and globulin of 4.0.   Assessment:  #1.  Primary biliary cholangitis.  She was diagnosed in March 2003.  Liver biopsy revealed stage II disease.  Evaluation with ultrasound and elastography suggested F3 and F4 disease.  She does not have any other stigmata of liver disease such as thrombocytopenia or splenomegaly.   Her WBC is borderline but she has had low WBC in the past as well. I suspect she also has steatohepatitis.  Steatohepatitis should improve with regular physical activity and gradual weight reduction.  #2.  History of small tubular adenoma on colonoscopy of February 2013.  Next colonoscopy in February 2023.   Plan:  Abdominal ultrasound with elastography. Continue Urso at current dose. Office visit in 1 year.

## 2019-08-10 NOTE — Patient Instructions (Signed)
Physician will call with results of ultrasound and elastography when completed

## 2019-08-11 DIAGNOSIS — R69 Illness, unspecified: Secondary | ICD-10-CM | POA: Diagnosis not present

## 2019-08-17 ENCOUNTER — Ambulatory Visit (HOSPITAL_COMMUNITY)
Admission: RE | Admit: 2019-08-17 | Discharge: 2019-08-17 | Disposition: A | Discharge status: Home / Self Care | Payer: Medicare HMO | Source: Ambulatory Visit | Attending: Internal Medicine | Admitting: Internal Medicine

## 2019-08-17 ENCOUNTER — Other Ambulatory Visit: Payer: Self-pay

## 2019-08-17 DIAGNOSIS — K769 Liver disease, unspecified: Secondary | ICD-10-CM | POA: Diagnosis not present

## 2019-08-17 DIAGNOSIS — K743 Primary biliary cirrhosis: Secondary | ICD-10-CM | POA: Insufficient documentation

## 2019-08-27 DIAGNOSIS — E2839 Other primary ovarian failure: Secondary | ICD-10-CM | POA: Diagnosis not present

## 2019-09-22 DIAGNOSIS — R69 Illness, unspecified: Secondary | ICD-10-CM | POA: Diagnosis not present

## 2019-11-03 DIAGNOSIS — I10 Essential (primary) hypertension: Secondary | ICD-10-CM | POA: Diagnosis not present

## 2019-11-03 DIAGNOSIS — R0989 Other specified symptoms and signs involving the circulatory and respiratory systems: Secondary | ICD-10-CM | POA: Diagnosis not present

## 2019-11-03 DIAGNOSIS — Z299 Encounter for prophylactic measures, unspecified: Secondary | ICD-10-CM | POA: Diagnosis not present

## 2019-11-03 DIAGNOSIS — Z6835 Body mass index (BMI) 35.0-35.9, adult: Secondary | ICD-10-CM | POA: Diagnosis not present

## 2019-11-03 DIAGNOSIS — Z713 Dietary counseling and surveillance: Secondary | ICD-10-CM | POA: Diagnosis not present

## 2019-11-08 DIAGNOSIS — I63039 Cerebral infarction due to thrombosis of unspecified carotid artery: Secondary | ICD-10-CM | POA: Diagnosis not present

## 2019-11-08 DIAGNOSIS — I63513 Cerebral infarction due to unspecified occlusion or stenosis of bilateral middle cerebral arteries: Secondary | ICD-10-CM | POA: Diagnosis not present

## 2019-11-30 DIAGNOSIS — R69 Illness, unspecified: Secondary | ICD-10-CM | POA: Diagnosis not present

## 2019-12-06 DIAGNOSIS — R69 Illness, unspecified: Secondary | ICD-10-CM | POA: Diagnosis not present

## 2020-01-05 DIAGNOSIS — R69 Illness, unspecified: Secondary | ICD-10-CM | POA: Diagnosis not present

## 2020-01-09 ENCOUNTER — Ambulatory Visit: Payer: Medicare HMO

## 2020-01-15 ENCOUNTER — Ambulatory Visit: Payer: Medicare HMO | Attending: Internal Medicine

## 2020-01-15 DIAGNOSIS — Z23 Encounter for immunization: Secondary | ICD-10-CM | POA: Insufficient documentation

## 2020-01-15 NOTE — Progress Notes (Signed)
   Covid-19 Vaccination Clinic  Name:  GENELLA BAS    MRN: [id number] DOB: Mar 07, 1956  01/15/2020  Ms. Ligman was observed post Covid-19 immunization for 15 minutes without incidence. She was provided with Vaccine Information Sheet and instruction to access the V-Safe system.   Ms. Lewing was instructed to call 911 with any severe reactions post vaccine: Marland Kitchen Difficulty breathing  . Swelling of your face and throat  . A fast heartbeat  . A bad rash all over your body  . Dizziness and weakness    Immunizations Administered    Name Date Dose VIS Date Route   Pfizer COVID-19 Vaccine 01/15/2020 12:11 PM 0.3 mL 11/19/2019 Intramuscular   Manufacturer: [place]   Lot: [id number]   [place]: [id number]

## 2020-01-20 ENCOUNTER — Ambulatory Visit: Payer: Medicare HMO

## 2020-01-21 IMAGING — US US ABDOMEN COMPLETE W/ ELASTOGRAPHY
2 series · 12 of 25 positions shown · non-contrast
Comparison: 04/18/2015

CLINICAL DATA: Primary biliary cirrhosis.



[Series 1: us abdomen complete w/ elastography · 9 of 92 slices shown (1 of 2)]
[im 6/92]
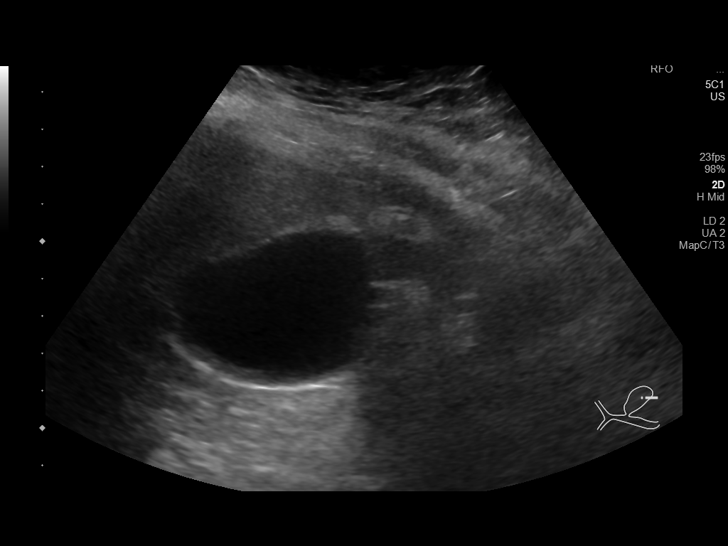
[im 17/92]
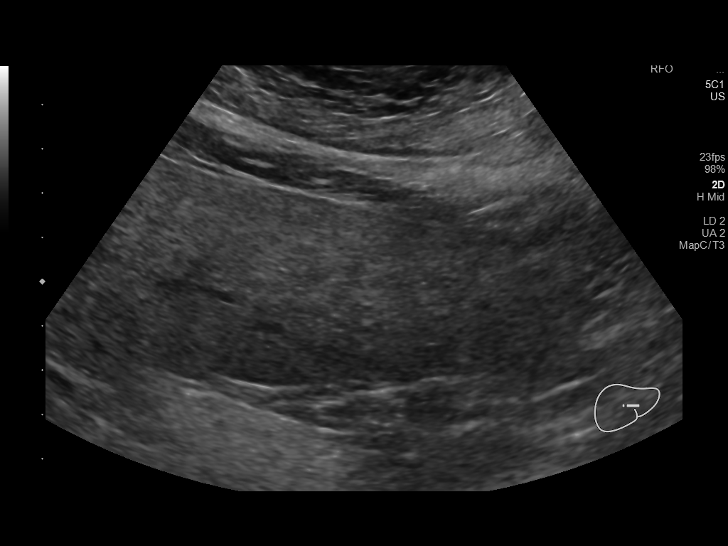
[im 27/92]
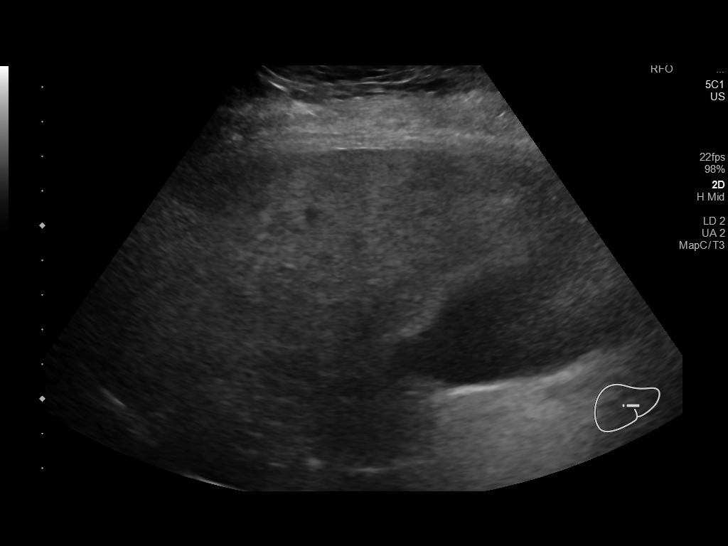
[im 38/92]
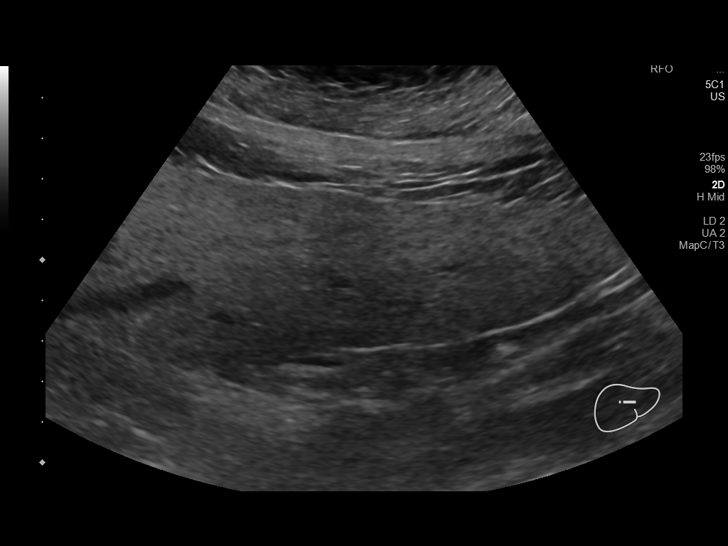
[im 49/92]
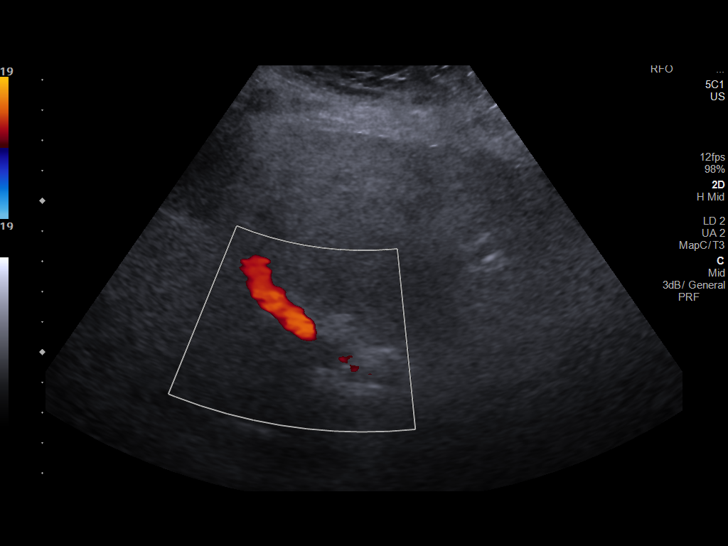
[im 59/92]
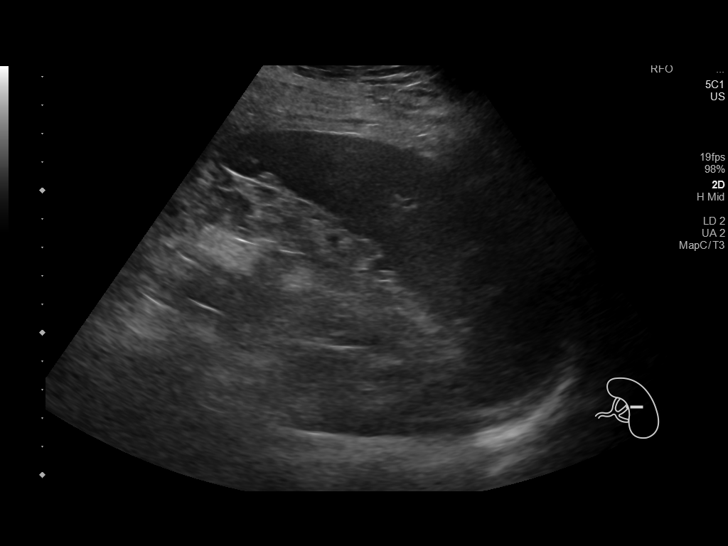
[im 70/92]
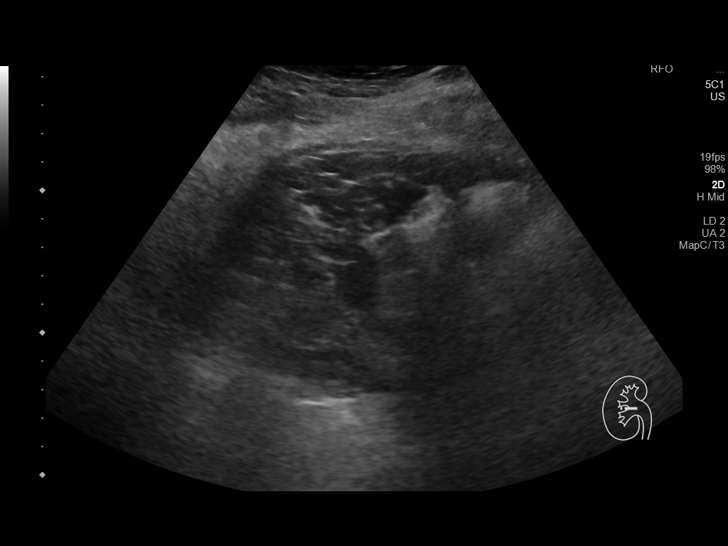
[im 81/92]
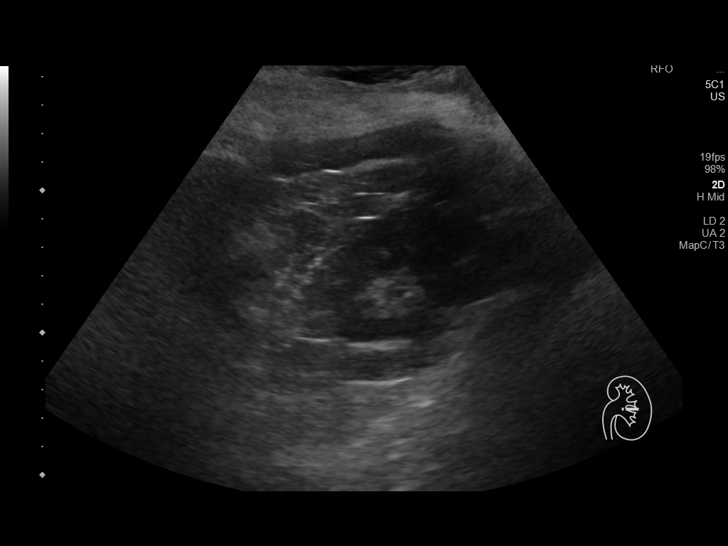
[im 92/92]
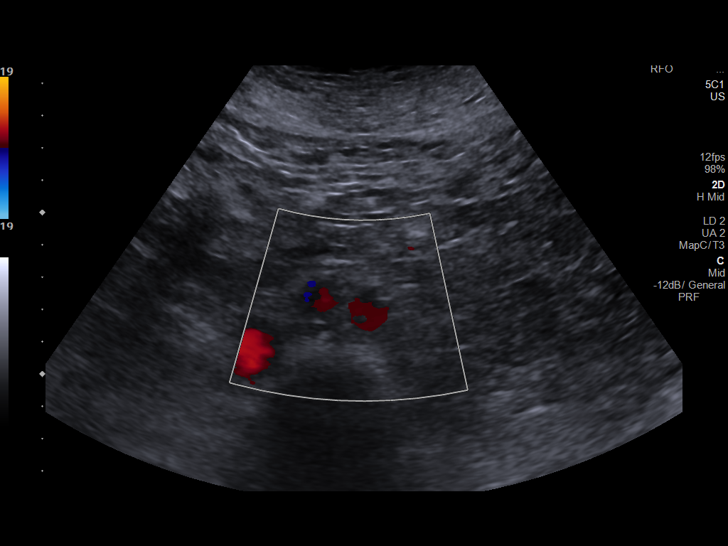

[Series 1001: us abdomen complete w/ elastography · 3 of 33 slices shown (2 of 2)]
[im 6/33]
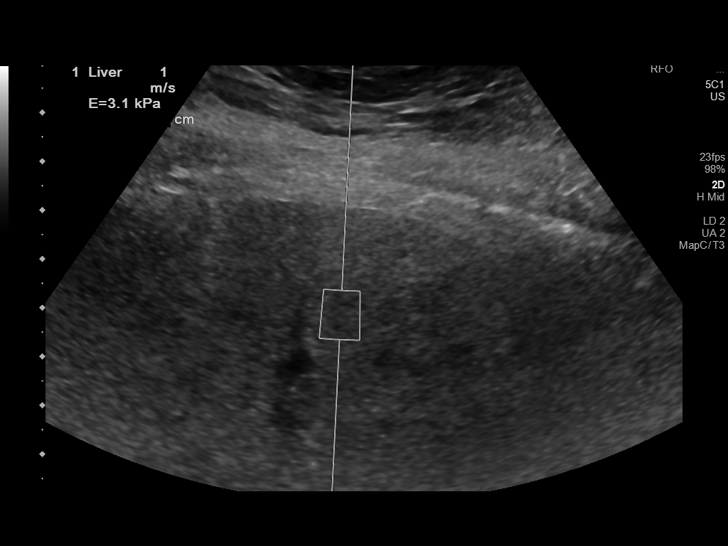
[im 17/33]
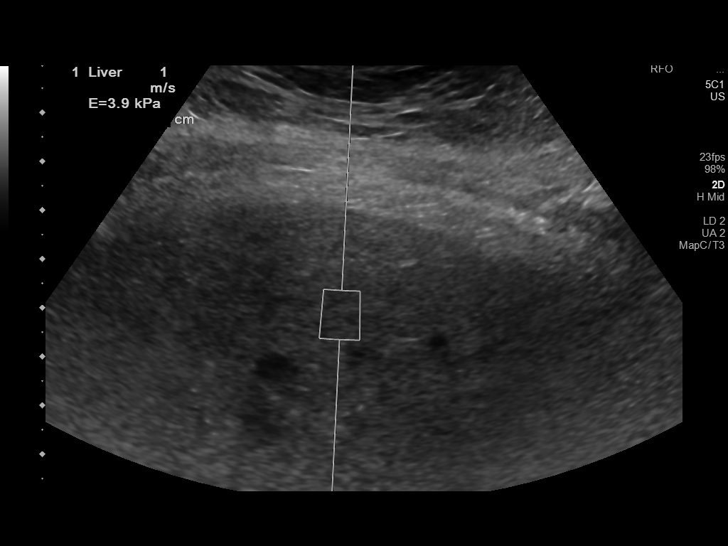
[im 27/33]
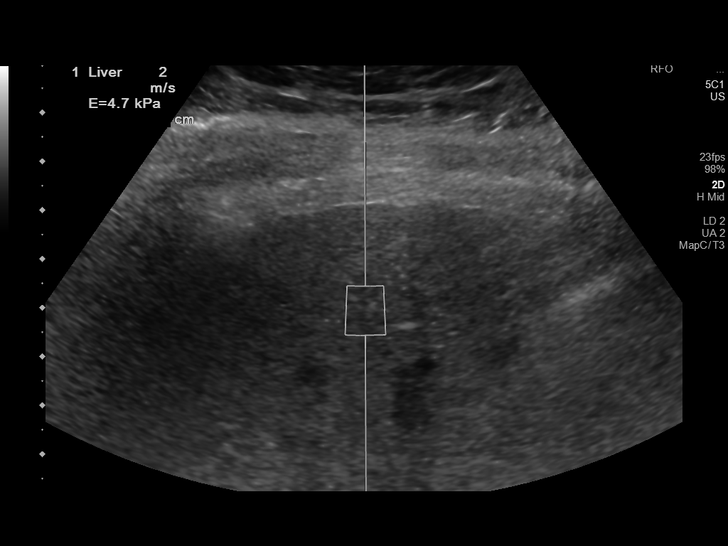

[12 of 25 positions shown; findings below may reference images not displayed]

FINDINGS: ULTRASOUND ABDOMEN

Gallbladder: No gallstones or wall thickening visualized. No
sonographic Murphy sign noted by sonographer.

Common bile duct: Diameter: 5 mm, within normal limits.

Liver: Diffusely increased echogenicity of hepatic parenchyma shows
no significant change, consistent with diffuse hepatocellular
disease. No liver mass identified. Portal vein is patent on color
Doppler imaging with normal direction of blood flow towards the
liver.

IVC: No abnormality visualized.

Pancreas: Visualized portion unremarkable.

Spleen: Mild splenomegaly with length of 13.6 cm and estimated
volume of 599 mL.

Right Kidney: Length: 9.1 cm. Echogenicity within normal limits. No
mass or hydronephrosis visualized.

Left Kidney: Length: 10.5 cm. Echogenicity within normal limits. No
mass or hydronephrosis visualized.

Abdominal aorta: No aneurysm visualized.

Other findings: No evidence of ascites.

ULTRASOUND HEPATIC ELASTOGRAPHY

Device: Siemens Helix VTQ

Patient position: Left Lateral Decubitus

Transducer [id number]

Number of measurements: 10

Hepatic segment:  8

Median velocity:   1.24 m/sec

IQR:

IQR/Median velocity ratio:

Corresponding Metavir fibrosis score:  F2 + some F3

Risk of fibrosis: Moderate

Limitations of exam: None

Please note that abnormal shear wave velocities may also be
identified in clinical settings other than with hepatic fibrosis,
such as: acute hepatitis, elevated right heart and central venous
pressures including use of beta blockers, Jenell disease
([place]), infiltrative processes such as
mastocytosis/amyloidosis/infiltrative tumor, extrahepatic
cholestasis, in the post-prandial state, and liver transplantation.
Correlation with patient history, laboratory data, and clinical
condition recommended.
IMPRESSION: ULTRASOUND ABDOMEN:

Diffuse hepatocellular disease, without significant change in
appearance compared to previous study. No liver mass identified.

Mild splenomegaly, suspicious for portal venous hypertension.

ULTRASOUND HEPATIC ELASTOGRAPHY:

Median hepatic shear wave velocity is calculated at 1.24 m/sec,
significantly decreased since previous study in 0376.

Corresponding Metavir fibrosis score is F2 + some F3.

Risk of fibrosis is Moderate.

Follow-up: Additional testing appropriate

## 2020-02-09 ENCOUNTER — Ambulatory Visit: Payer: Medicare HMO | Attending: Internal Medicine

## 2020-02-09 DIAGNOSIS — Z23 Encounter for immunization: Secondary | ICD-10-CM | POA: Insufficient documentation

## 2020-02-09 NOTE — Progress Notes (Signed)
   Covid-19 Vaccination Clinic  Name:  AVARIE TAVANO    MRN: [id number] DOB: 1955-12-19  02/09/2020  Ms. Tafolla was observed post Covid-19 immunization for 15 minutes without incident. She was provided with Vaccine Information Sheet and instruction to access the V-Safe system.   Ms. Leinbach was instructed to call 911 with any severe reactions post vaccine: Marland Kitchen Difficulty breathing  . Swelling of face and throat  . A fast heartbeat  . A bad rash all over body  . Dizziness and weakness   Immunizations Administered    Name Date Dose VIS Date Route   Pfizer COVID-19 Vaccine 02/09/2020  9:37 AM 0.3 mL 11/19/2019 Intramuscular   Manufacturer: [place]   Lot: [id number]   [place]: [id number]

## 2020-02-16 DIAGNOSIS — Z6835 Body mass index (BMI) 35.0-35.9, adult: Secondary | ICD-10-CM | POA: Diagnosis not present

## 2020-02-16 DIAGNOSIS — Z299 Encounter for prophylactic measures, unspecified: Secondary | ICD-10-CM | POA: Diagnosis not present

## 2020-02-16 DIAGNOSIS — I10 Essential (primary) hypertension: Secondary | ICD-10-CM | POA: Diagnosis not present

## 2020-02-16 DIAGNOSIS — K745 Biliary cirrhosis, unspecified: Secondary | ICD-10-CM | POA: Diagnosis not present

## 2020-02-29 DIAGNOSIS — R69 Illness, unspecified: Secondary | ICD-10-CM | POA: Diagnosis not present

## 2020-03-08 DIAGNOSIS — L719 Rosacea, unspecified: Secondary | ICD-10-CM | POA: Diagnosis not present

## 2020-03-15 DIAGNOSIS — Z299 Encounter for prophylactic measures, unspecified: Secondary | ICD-10-CM | POA: Diagnosis not present

## 2020-03-15 DIAGNOSIS — K745 Biliary cirrhosis, unspecified: Secondary | ICD-10-CM | POA: Diagnosis not present

## 2020-03-15 DIAGNOSIS — I10 Essential (primary) hypertension: Secondary | ICD-10-CM | POA: Diagnosis not present

## 2020-05-10 DIAGNOSIS — J320 Chronic maxillary sinusitis: Secondary | ICD-10-CM | POA: Diagnosis not present

## 2020-05-10 DIAGNOSIS — K745 Biliary cirrhosis, unspecified: Secondary | ICD-10-CM | POA: Diagnosis not present

## 2020-05-10 DIAGNOSIS — Z789 Other specified health status: Secondary | ICD-10-CM | POA: Diagnosis not present

## 2020-05-10 DIAGNOSIS — Z299 Encounter for prophylactic measures, unspecified: Secondary | ICD-10-CM | POA: Diagnosis not present

## 2020-06-28 DIAGNOSIS — I10 Essential (primary) hypertension: Secondary | ICD-10-CM | POA: Diagnosis not present

## 2020-06-28 DIAGNOSIS — Z299 Encounter for prophylactic measures, unspecified: Secondary | ICD-10-CM | POA: Diagnosis not present

## 2020-06-28 DIAGNOSIS — K745 Biliary cirrhosis, unspecified: Secondary | ICD-10-CM | POA: Diagnosis not present

## 2020-07-26 DIAGNOSIS — R69 Illness, unspecified: Secondary | ICD-10-CM | POA: Diagnosis not present

## 2020-08-10 DIAGNOSIS — R5383 Other fatigue: Secondary | ICD-10-CM | POA: Diagnosis not present

## 2020-08-10 DIAGNOSIS — Z1339 Encounter for screening examination for other mental health and behavioral disorders: Secondary | ICD-10-CM | POA: Diagnosis not present

## 2020-08-10 DIAGNOSIS — I10 Essential (primary) hypertension: Secondary | ICD-10-CM | POA: Diagnosis not present

## 2020-08-10 DIAGNOSIS — Z299 Encounter for prophylactic measures, unspecified: Secondary | ICD-10-CM | POA: Diagnosis not present

## 2020-08-10 DIAGNOSIS — Z0000 Encounter for general adult medical examination without abnormal findings: Secondary | ICD-10-CM | POA: Diagnosis not present

## 2020-08-10 DIAGNOSIS — Z79899 Other long term (current) drug therapy: Secondary | ICD-10-CM | POA: Diagnosis not present

## 2020-08-10 DIAGNOSIS — E7800 Pure hypercholesterolemia, unspecified: Secondary | ICD-10-CM | POA: Diagnosis not present

## 2020-08-10 DIAGNOSIS — Z1331 Encounter for screening for depression: Secondary | ICD-10-CM | POA: Diagnosis not present

## 2020-08-10 DIAGNOSIS — Z7189 Other specified counseling: Secondary | ICD-10-CM | POA: Diagnosis not present

## 2020-08-10 DIAGNOSIS — R69 Illness, unspecified: Secondary | ICD-10-CM | POA: Diagnosis not present

## 2020-08-10 DIAGNOSIS — Z6835 Body mass index (BMI) 35.0-35.9, adult: Secondary | ICD-10-CM | POA: Diagnosis not present

## 2020-08-15 ENCOUNTER — Other Ambulatory Visit: Payer: Self-pay

## 2020-08-15 ENCOUNTER — Encounter (INDEPENDENT_AMBULATORY_CARE_PROVIDER_SITE_OTHER): Payer: Self-pay | Admitting: Internal Medicine

## 2020-08-15 ENCOUNTER — Ambulatory Visit (INDEPENDENT_AMBULATORY_CARE_PROVIDER_SITE_OTHER): Payer: Medicare HMO | Admitting: Internal Medicine

## 2020-08-15 VITALS — BP 121/80 | HR 72 | Temp 98.7°F | Ht 66.0 in | Wt 214.6 lb

## 2020-08-15 DIAGNOSIS — K743 Primary biliary cirrhosis: Secondary | ICD-10-CM

## 2020-08-15 MED ORDER — URSODIOL 300 MG PO CAPS: 600.0000 mg | ORAL_CAPSULE | Freq: Two times a day (BID) | ORAL | 11 refills | Status: DC

## 2020-08-15 NOTE — Progress Notes (Signed)
Presenting complaint;  Follow-up for primary biliary cholangitis.  Database and subjective:  Patient is 64 year old Caucasian female who has history of primary biliary cholangitis which was diagnosed on liver biopsy in 2003 when she had stage II disease. She had elastography in May 2016 suggesting F3 and F4 disease. However elastography in September 2020 suggested F2 and some F3 disease. She is also suspected to have fatty liver. She was last seen 1 year ago. She has no complaints.  She states she found her glucose was mildly elevated and she has been more active lately.  She is using recumbent bike for exercise.  She is doing 15 to 20 minutes almost daily.  She is also doing renovation on her grandmother's house.  She denies abdominal pain nausea vomiting heartburn or pruritus.  Her bowels move daily.  She denies melena or rectal bleeding. She had blood work recently which is reviewed below.  Current Medications: Outpatient Encounter Medications as of 08/15/2020  Medication Sig   acetaminophen (TYLENOL) 500 MG tablet Take 650 mg by mouth as needed.    amLODipine (NORVASC) 5 MG tablet Take by mouth daily.    aspirin 81 MG tablet Take 81 mg by mouth daily.   Cholecalciferol (VITAMIN D3 PO) Take 2,000 Units by mouth daily.   doxycycline (VIBRA-TABS) 100 MG tablet Take 100 mg by mouth as needed.    DUEXIS 800-26.6 MG TABS Take 1 tablet by mouth every 8 (eight) hours.   fish oil-omega-3 fatty acids 1000 MG capsule 1,200 mg daily.    hydrochlorothiazide (HYDRODIURIL) 12.5 MG tablet Take 12.5 mg by mouth daily.   Melatonin-Pyridoxine (MELATIN PO) Take by mouth. As needed for sleep   Multiple Vitamin (MULTIVITAMIN) capsule Take 1 capsule by mouth daily.   NON FORMULARY 500 mg daily. Turmeric - Patient stats that she takes 1 a day   ursodiol (ACTIGALL) 300 MG capsule Take 2 capsules (600 mg total) by mouth 2 (two) times daily.   No facility-administered encounter medications on file  as of 08/15/2020.     Objective: Blood pressure 121/80, pulse 72, temperature 98.7 F (37.1 C), temperature source Oral, height 5\' 6"  (1.676 m), weight 214 lb 9.6 oz (97.3 kg). Patient is alert and in no acute distress. She is wearing a mask. Conjunctiva is pink. Sclera is nonicteric Oropharyngeal mucosa is normal. No neck masses or thyromegaly noted. Cardiac exam with regular rhythm normal S1 and S2. No murmur or gallop noted. Lungs are clear to auscultation. Abdomen is full but soft and nontender with no organomegaly or masses. No LE edema or clubbing noted.  Labs/studies Results: Lab data from 08/10/2020 Bilirubin 0.5, AP 144, AST 51 and ALT 47.  Total protein 7.9 with albumin of 4.3. BUN 10 and creatinine 0.87 WBC 3.4 H&H 13.9 and 41.3 Platelet count 177K.  Assessment:  #1.  Primary biliary cholangitis.  Disease duration 18 years.  AST and ALT are mildly elevated.  I suspect she also has a fatty liver.  Based on elastography 1 year ago she had F2 and  F3 disease.  To improve her prognosis she must work on losing weight and continue her physical activity.  Her goal should be at least 4 hours/week.   Plan:  Continue Urso at current dose.  New prescription sent to patient's pharmacy for 1 month with 11 refills. She will LFTs in 3 months.  If transaminases remain elevated would consider liver biopsy to guide with therapy. Office visit in 1 year.

## 2020-08-15 NOTE — Patient Instructions (Signed)
LFTs and serum IgM to be done in 3 months.  Office will remind you.

## 2020-09-06 DIAGNOSIS — L814 Other melanin hyperpigmentation: Secondary | ICD-10-CM | POA: Diagnosis not present

## 2020-09-06 DIAGNOSIS — D1801 Hemangioma of skin and subcutaneous tissue: Secondary | ICD-10-CM | POA: Diagnosis not present

## 2020-09-06 DIAGNOSIS — L719 Rosacea, unspecified: Secondary | ICD-10-CM | POA: Diagnosis not present

## 2020-09-06 DIAGNOSIS — L570 Actinic keratosis: Secondary | ICD-10-CM | POA: Diagnosis not present

## 2020-09-15 DIAGNOSIS — R69 Illness, unspecified: Secondary | ICD-10-CM | POA: Diagnosis not present

## 2020-09-27 DIAGNOSIS — R69 Illness, unspecified: Secondary | ICD-10-CM | POA: Diagnosis not present

## 2020-10-30 ENCOUNTER — Other Ambulatory Visit (INDEPENDENT_AMBULATORY_CARE_PROVIDER_SITE_OTHER): Payer: Self-pay | Admitting: *Deleted

## 2020-10-30 DIAGNOSIS — K743 Primary biliary cirrhosis: Secondary | ICD-10-CM

## 2020-11-07 DIAGNOSIS — Z1231 Encounter for screening mammogram for malignant neoplasm of breast: Secondary | ICD-10-CM | POA: Diagnosis not present

## 2020-11-14 DIAGNOSIS — K743 Primary biliary cirrhosis: Secondary | ICD-10-CM | POA: Diagnosis not present

## 2020-11-15 LAB — HEPATIC FUNCTION PANEL
AG Ratio: 1.1 (calc) (ref 1.0–2.5)
ALT: 56 U/L — ABNORMAL HIGH (ref 6–29)
AST: 70 U/L — ABNORMAL HIGH (ref 10–35)
Albumin: 4 g/dL (ref 3.6–5.1)
Alkaline phosphatase (APISO): 113 U/L (ref 37–153)
Bilirubin, Direct: 0.2 mg/dL (ref 0.0–0.2)
Globulin: 3.6 g/dL (calc) (ref 1.9–3.7)
Indirect Bilirubin: 0.3 mg/dL (calc) (ref 0.2–1.2)
Total Bilirubin: 0.5 mg/dL (ref 0.2–1.2)
Total Protein: 7.6 g/dL (ref 6.1–8.1)

## 2020-11-15 LAB — IGM: IgM, Serum: 296 mg/dL (ref 50–300)

## 2020-11-21 ENCOUNTER — Other Ambulatory Visit (INDEPENDENT_AMBULATORY_CARE_PROVIDER_SITE_OTHER): Payer: Self-pay | Admitting: *Deleted

## 2020-11-21 DIAGNOSIS — R7989 Other specified abnormal findings of blood chemistry: Secondary | ICD-10-CM

## 2020-11-22 DIAGNOSIS — I10 Essential (primary) hypertension: Secondary | ICD-10-CM | POA: Diagnosis not present

## 2020-11-22 DIAGNOSIS — K745 Biliary cirrhosis, unspecified: Secondary | ICD-10-CM | POA: Diagnosis not present

## 2020-11-22 DIAGNOSIS — Z713 Dietary counseling and surveillance: Secondary | ICD-10-CM | POA: Diagnosis not present

## 2020-11-22 DIAGNOSIS — Z6833 Body mass index (BMI) 33.0-33.9, adult: Secondary | ICD-10-CM | POA: Diagnosis not present

## 2020-11-22 DIAGNOSIS — Z299 Encounter for prophylactic measures, unspecified: Secondary | ICD-10-CM | POA: Diagnosis not present

## 2020-12-07 ENCOUNTER — Other Ambulatory Visit (INDEPENDENT_AMBULATORY_CARE_PROVIDER_SITE_OTHER): Payer: Self-pay | Admitting: *Deleted

## 2020-12-07 DIAGNOSIS — R7989 Other specified abnormal findings of blood chemistry: Secondary | ICD-10-CM

## 2020-12-08 DIAGNOSIS — I10 Essential (primary) hypertension: Secondary | ICD-10-CM | POA: Diagnosis not present

## 2020-12-08 DIAGNOSIS — M8580 Other specified disorders of bone density and structure, unspecified site: Secondary | ICD-10-CM | POA: Diagnosis not present

## 2020-12-11 ENCOUNTER — Other Ambulatory Visit (INDEPENDENT_AMBULATORY_CARE_PROVIDER_SITE_OTHER): Payer: Self-pay | Admitting: *Deleted

## 2020-12-15 DIAGNOSIS — R7989 Other specified abnormal findings of blood chemistry: Secondary | ICD-10-CM | POA: Diagnosis not present

## 2020-12-16 LAB — HEPATIC FUNCTION PANEL
AG Ratio: 1.1 (calc) (ref 1.0–2.5)
ALT: 57 U/L — ABNORMAL HIGH (ref 6–29)
AST: 66 U/L — ABNORMAL HIGH (ref 10–35)
Albumin: 4.2 g/dL (ref 3.6–5.1)
Alkaline phosphatase (APISO): 115 U/L (ref 37–153)
Bilirubin, Direct: 0.2 mg/dL (ref 0.0–0.2)
Globulin: 3.7 g/dL (calc) (ref 1.9–3.7)
Indirect Bilirubin: 0.3 mg/dL (calc) (ref 0.2–1.2)
Total Bilirubin: 0.5 mg/dL (ref 0.2–1.2)
Total Protein: 7.9 g/dL (ref 6.1–8.1)

## 2020-12-22 ENCOUNTER — Other Ambulatory Visit (INDEPENDENT_AMBULATORY_CARE_PROVIDER_SITE_OTHER): Payer: Self-pay | Admitting: *Deleted

## 2020-12-22 DIAGNOSIS — K743 Primary biliary cirrhosis: Secondary | ICD-10-CM

## 2021-01-03 DIAGNOSIS — H43813 Vitreous degeneration, bilateral: Secondary | ICD-10-CM | POA: Diagnosis not present

## 2021-01-03 DIAGNOSIS — H43393 Other vitreous opacities, bilateral: Secondary | ICD-10-CM | POA: Diagnosis not present

## 2021-01-03 DIAGNOSIS — H31091 Other chorioretinal scars, right eye: Secondary | ICD-10-CM | POA: Diagnosis not present

## 2021-01-03 DIAGNOSIS — H04123 Dry eye syndrome of bilateral lacrimal glands: Secondary | ICD-10-CM | POA: Diagnosis not present

## 2021-02-21 DIAGNOSIS — K745 Biliary cirrhosis, unspecified: Secondary | ICD-10-CM | POA: Diagnosis not present

## 2021-02-21 DIAGNOSIS — I10 Essential (primary) hypertension: Secondary | ICD-10-CM | POA: Diagnosis not present

## 2021-02-21 DIAGNOSIS — Z299 Encounter for prophylactic measures, unspecified: Secondary | ICD-10-CM | POA: Diagnosis not present

## 2021-02-21 DIAGNOSIS — Z789 Other specified health status: Secondary | ICD-10-CM | POA: Diagnosis not present

## 2021-03-07 DIAGNOSIS — H31091 Other chorioretinal scars, right eye: Secondary | ICD-10-CM | POA: Diagnosis not present

## 2021-03-07 DIAGNOSIS — H2513 Age-related nuclear cataract, bilateral: Secondary | ICD-10-CM | POA: Diagnosis not present

## 2021-03-07 DIAGNOSIS — H43813 Vitreous degeneration, bilateral: Secondary | ICD-10-CM | POA: Diagnosis not present

## 2021-03-07 DIAGNOSIS — M8580 Other specified disorders of bone density and structure, unspecified site: Secondary | ICD-10-CM | POA: Diagnosis not present

## 2021-03-07 DIAGNOSIS — H43393 Other vitreous opacities, bilateral: Secondary | ICD-10-CM | POA: Diagnosis not present

## 2021-03-07 DIAGNOSIS — I10 Essential (primary) hypertension: Secondary | ICD-10-CM | POA: Diagnosis not present

## 2021-03-21 ENCOUNTER — Other Ambulatory Visit (INDEPENDENT_AMBULATORY_CARE_PROVIDER_SITE_OTHER): Payer: Self-pay | Admitting: Internal Medicine

## 2021-03-21 DIAGNOSIS — K743 Primary biliary cirrhosis: Secondary | ICD-10-CM | POA: Diagnosis not present

## 2021-03-22 LAB — HEPATIC FUNCTION PANEL
AG Ratio: 1.1 (calc) (ref 1.0–2.5)
ALT: 34 U/L — ABNORMAL HIGH (ref 6–29)
AST: 42 U/L — ABNORMAL HIGH (ref 10–35)
Albumin: 3.8 g/dL (ref 3.6–5.1)
Alkaline phosphatase (APISO): 107 U/L (ref 37–153)
Bilirubin, Direct: 0.1 mg/dL (ref 0.0–0.2)
Globulin: 3.5 g/dL (calc) (ref 1.9–3.7)
Indirect Bilirubin: 0.3 mg/dL (calc) (ref 0.2–1.2)
Total Bilirubin: 0.4 mg/dL (ref 0.2–1.2)
Total Protein: 7.3 g/dL (ref 6.1–8.1)

## 2021-05-07 DIAGNOSIS — N184 Chronic kidney disease, stage 4 (severe): Secondary | ICD-10-CM | POA: Diagnosis not present

## 2021-05-07 DIAGNOSIS — E7849 Other hyperlipidemia: Secondary | ICD-10-CM | POA: Diagnosis not present

## 2021-05-07 DIAGNOSIS — K219 Gastro-esophageal reflux disease without esophagitis: Secondary | ICD-10-CM | POA: Diagnosis not present

## 2021-05-07 DIAGNOSIS — I129 Hypertensive chronic kidney disease with stage 1 through stage 4 chronic kidney disease, or unspecified chronic kidney disease: Secondary | ICD-10-CM | POA: Diagnosis not present

## 2021-05-30 DIAGNOSIS — K745 Biliary cirrhosis, unspecified: Secondary | ICD-10-CM | POA: Diagnosis not present

## 2021-05-30 DIAGNOSIS — Z299 Encounter for prophylactic measures, unspecified: Secondary | ICD-10-CM | POA: Diagnosis not present

## 2021-05-30 DIAGNOSIS — I700 Atherosclerosis of aorta: Secondary | ICD-10-CM | POA: Diagnosis not present

## 2021-05-30 DIAGNOSIS — Z6830 Body mass index (BMI) 30.0-30.9, adult: Secondary | ICD-10-CM | POA: Diagnosis not present

## 2021-05-30 DIAGNOSIS — I10 Essential (primary) hypertension: Secondary | ICD-10-CM | POA: Diagnosis not present

## 2021-08-07 ENCOUNTER — Encounter (INDEPENDENT_AMBULATORY_CARE_PROVIDER_SITE_OTHER): Payer: Self-pay | Admitting: *Deleted

## 2021-08-08 DIAGNOSIS — I129 Hypertensive chronic kidney disease with stage 1 through stage 4 chronic kidney disease, or unspecified chronic kidney disease: Secondary | ICD-10-CM | POA: Diagnosis not present

## 2021-08-08 DIAGNOSIS — K219 Gastro-esophageal reflux disease without esophagitis: Secondary | ICD-10-CM | POA: Diagnosis not present

## 2021-08-08 DIAGNOSIS — N184 Chronic kidney disease, stage 4 (severe): Secondary | ICD-10-CM | POA: Diagnosis not present

## 2021-08-08 DIAGNOSIS — E7849 Other hyperlipidemia: Secondary | ICD-10-CM | POA: Diagnosis not present

## 2021-08-16 ENCOUNTER — Ambulatory Visit (INDEPENDENT_AMBULATORY_CARE_PROVIDER_SITE_OTHER): Payer: Medicare HMO | Admitting: Gastroenterology

## 2021-08-23 ENCOUNTER — Other Ambulatory Visit (INDEPENDENT_AMBULATORY_CARE_PROVIDER_SITE_OTHER): Payer: Self-pay

## 2021-08-23 ENCOUNTER — Other Ambulatory Visit (INDEPENDENT_AMBULATORY_CARE_PROVIDER_SITE_OTHER): Payer: Self-pay | Admitting: Internal Medicine

## 2021-08-23 DIAGNOSIS — K743 Primary biliary cirrhosis: Secondary | ICD-10-CM

## 2021-08-23 NOTE — Telephone Encounter (Signed)
Last seen 08/15/20 and has upcoming appt in october

## 2021-08-28 ENCOUNTER — Ambulatory Visit (HOSPITAL_COMMUNITY)
Admission: RE | Admit: 2021-08-28 | Discharge: 2021-08-28 | Disposition: A | Discharge status: Home / Self Care | Payer: Medicare HMO | Source: Ambulatory Visit | Attending: Internal Medicine | Admitting: Internal Medicine

## 2021-08-28 ENCOUNTER — Other Ambulatory Visit: Payer: Self-pay

## 2021-08-28 DIAGNOSIS — R161 Splenomegaly, not elsewhere classified: Secondary | ICD-10-CM | POA: Diagnosis not present

## 2021-08-28 DIAGNOSIS — K743 Primary biliary cirrhosis: Secondary | ICD-10-CM

## 2021-08-28 DIAGNOSIS — K7689 Other specified diseases of liver: Secondary | ICD-10-CM | POA: Diagnosis not present

## 2021-08-29 DIAGNOSIS — E7800 Pure hypercholesterolemia, unspecified: Secondary | ICD-10-CM | POA: Diagnosis not present

## 2021-08-29 DIAGNOSIS — Z1339 Encounter for screening examination for other mental health and behavioral disorders: Secondary | ICD-10-CM | POA: Diagnosis not present

## 2021-08-29 DIAGNOSIS — M25512 Pain in left shoulder: Secondary | ICD-10-CM | POA: Diagnosis not present

## 2021-08-29 DIAGNOSIS — I10 Essential (primary) hypertension: Secondary | ICD-10-CM | POA: Diagnosis not present

## 2021-08-29 DIAGNOSIS — Z7189 Other specified counseling: Secondary | ICD-10-CM | POA: Diagnosis not present

## 2021-08-29 DIAGNOSIS — Z1331 Encounter for screening for depression: Secondary | ICD-10-CM | POA: Diagnosis not present

## 2021-08-29 DIAGNOSIS — Z6830 Body mass index (BMI) 30.0-30.9, adult: Secondary | ICD-10-CM | POA: Diagnosis not present

## 2021-08-29 DIAGNOSIS — Z789 Other specified health status: Secondary | ICD-10-CM | POA: Diagnosis not present

## 2021-08-29 DIAGNOSIS — M67432 Ganglion, left wrist: Secondary | ICD-10-CM | POA: Diagnosis not present

## 2021-08-29 DIAGNOSIS — Z0000 Encounter for general adult medical examination without abnormal findings: Secondary | ICD-10-CM | POA: Diagnosis not present

## 2021-08-29 DIAGNOSIS — R5383 Other fatigue: Secondary | ICD-10-CM | POA: Diagnosis not present

## 2021-08-29 DIAGNOSIS — Z299 Encounter for prophylactic measures, unspecified: Secondary | ICD-10-CM | POA: Diagnosis not present

## 2021-08-30 DIAGNOSIS — Z79899 Other long term (current) drug therapy: Secondary | ICD-10-CM | POA: Diagnosis not present

## 2021-08-30 DIAGNOSIS — M25512 Pain in left shoulder: Secondary | ICD-10-CM | POA: Diagnosis not present

## 2021-08-30 DIAGNOSIS — E559 Vitamin D deficiency, unspecified: Secondary | ICD-10-CM | POA: Diagnosis not present

## 2021-08-30 DIAGNOSIS — M7989 Other specified soft tissue disorders: Secondary | ICD-10-CM | POA: Diagnosis not present

## 2021-08-30 DIAGNOSIS — E7800 Pure hypercholesterolemia, unspecified: Secondary | ICD-10-CM | POA: Diagnosis not present

## 2021-08-30 DIAGNOSIS — R2232 Localized swelling, mass and lump, left upper limb: Secondary | ICD-10-CM | POA: Diagnosis not present

## 2021-08-30 DIAGNOSIS — Z0000 Encounter for general adult medical examination without abnormal findings: Secondary | ICD-10-CM | POA: Diagnosis not present

## 2021-08-30 DIAGNOSIS — M19012 Primary osteoarthritis, left shoulder: Secondary | ICD-10-CM | POA: Diagnosis not present

## 2021-08-30 DIAGNOSIS — M25712 Osteophyte, left shoulder: Secondary | ICD-10-CM | POA: Diagnosis not present

## 2021-09-10 ENCOUNTER — Ambulatory Visit (INDEPENDENT_AMBULATORY_CARE_PROVIDER_SITE_OTHER): Payer: Medicare HMO | Admitting: Gastroenterology

## 2021-09-11 ENCOUNTER — Other Ambulatory Visit: Payer: Self-pay

## 2021-09-11 ENCOUNTER — Encounter (INDEPENDENT_AMBULATORY_CARE_PROVIDER_SITE_OTHER): Payer: Self-pay | Admitting: Internal Medicine

## 2021-09-11 ENCOUNTER — Ambulatory Visit (INDEPENDENT_AMBULATORY_CARE_PROVIDER_SITE_OTHER): Payer: Medicare HMO | Admitting: Internal Medicine

## 2021-09-11 VITALS — BP 130/79 | HR 80 | Temp 98.2°F | Ht 66.0 in

## 2021-09-11 DIAGNOSIS — K7400 Hepatic fibrosis, unspecified: Secondary | ICD-10-CM | POA: Diagnosis not present

## 2021-09-11 DIAGNOSIS — K743 Primary biliary cirrhosis: Secondary | ICD-10-CM

## 2021-09-11 DIAGNOSIS — M818 Other osteoporosis without current pathological fracture: Secondary | ICD-10-CM | POA: Diagnosis not present

## 2021-09-11 NOTE — Patient Instructions (Signed)
LFTs in February 2023. Will schedule EGD and colonoscopy in February 2023.

## 2021-09-11 NOTE — Progress Notes (Signed)
Presenting complaint;  Follow-up for primary biliary cholangitis.  Database and subjective:  Patient is 65 year old Caucasian female who is here for yearly visit.  She was diagnosed with primary biliary cholangitis in 2003.  Liver biopsy revealed stage II disease.  She has been maintained Urso ever since. She is also felt to have fatty liver disease. Elastography in May 2016 suggested F3 and F4 disease. However elastography in September 2020 suggested F2 and some F3 disease. She had elastography week for last results of which are discussed below.  Patient has no GI complaints.  She denies abdominal pain pruritus heartburn or dysphagia.  She has lost 34 pounds since her visit 1 year ago.  She states she has lost weight because of portion control and she is eating very healthy.  Her bowels move daily.  She denies melena or rectal bleeding. She was having back pain but that has gotten better.  She had been taking Duexis which she has not taken in 4 days.  She has pain in her left shoulder and left arm.  She has been referred to orthopedic surgeon and she is still waiting for a call from their office. She also brought a long copy of her blood work which is summarized below. Her last colonoscopy was in April 2013 when she had a 3 mm tubular adenoma and the other polyp was lymphoid polyp.  Was decided to wait 10 years before next exam. Patient says she is scheduled for bone density study in near future.   Current Medications: Outpatient Encounter Medications as of 09/11/2021  Medication Sig   acetaminophen (TYLENOL) 500 MG tablet Take 650 mg by mouth as needed.    amLODipine (NORVASC) 5 MG tablet Take by mouth daily.    aspirin 81 MG tablet Take 81 mg by mouth daily.   Cholecalciferol (VITAMIN D3 PO) Take 2,000 Units by mouth daily.   doxycycline (VIBRA-TABS) 100 MG tablet Take 100 mg by mouth as needed.    fish oil-omega-3 fatty acids 1000 MG capsule 1,200 mg daily.    hydrochlorothiazide  (HYDRODIURIL) 12.5 MG tablet Take 12.5 mg by mouth daily.   Ibuprofen-Famotidine (DUEXIS) 800-26.6 MG TABS Take by mouth. Take one every 8 hours prn   Melatonin-Pyridoxine (MELATIN PO) Take by mouth. As needed for sleep   Multiple Vitamin (MULTIVITAMIN) capsule Take 1 capsule by mouth daily.   NON FORMULARY 500 mg daily. Turmeric - Patient stats that she takes 1 a day   [place] all - muscle and joint vanishing scent gel. Active ingredient menthol 2.5%. use as needed   ursodiol (ACTIGALL) 300 MG capsule TAKE 2 CAPSULES (600 MG TOTAL) BY MOUTH 2 (TWO) TIMES DAILY.   famotidine (PEPCID) 20 MG tablet Take 20 mg by mouth 2 (two) times daily. (Patient not taking: Reported on 09/11/2021)   ibuprofen (ADVIL) 800 MG tablet Take 800 mg by mouth every 8 (eight) hours as needed. (Patient not taking: Reported on 09/11/2021)   No facility-administered encounter medications on file as of 09/11/2021.      Objective: Blood pressure 130/79, pulse 80, temperature 98.2 F (36.8 C), temperature source Oral, height 5' 6"  (1.676 m). Weight 180 lbs. Patient is alert and in no acute distress. Conjunctiva is pink. Sclera is nonicteric Oropharyngeal mucosa is normal. No neck masses or thyromegaly noted. Cardiac exam with regular rhythm normal S1 and S2. No murmur or gallop noted. Lungs are clear to auscultation. Abdomen is full but soft and nontender with organomegaly or masses.  No LE edema or clubbing noted.  Labs/studies Results:   CBC Latest Ref Rng & Units 10/23/2015 03/14/2014 07/15/2013  WBC 4.0 - 10.5 K/uL 4.1 4.5 4.9  Hemoglobin 12.0 - 15.0 g/dL 13.3 13.7 13.4  Hematocrit 36.0 - 46.0 % 40.8 40.1 39.4  Platelets 150 - 400 K/uL 213 229 165    CMP Latest Ref Rng & Units 03/21/2021 12/15/2020 11/14/2020  Glucose 65 - 99 mg/dL - - -  BUN 7 - 25 mg/dL - - -  Creatinine 0.50 - 0.99 mg/dL - - -  Sodium 135 - 146 mmol/L - - -  Potassium 3.5 - 5.3 mmol/L - - -  Chloride 98 - 110 mmol/L - -  -  CO2 20 - 31 mmol/L - - -  Calcium 8.6 - 10.4 mg/dL - - -  Total Protein 6.1 - 8.1 g/dL 7.3 7.9 7.6  Total Bilirubin 0.2 - 1.2 mg/dL 0.4 0.5 0.5  Alkaline Phos 33 - 130 U/L - - -  AST 10 - 35 U/L 42(H) 66(H) 70(H)  ALT 6 - 29 U/L 34(H) 57(H) 56(H)    Hepatic Function Latest Ref Rng & Units 03/21/2021 12/15/2020 11/14/2020  Total Protein 6.1 - 8.1 g/dL 7.3 7.9 7.6  Albumin 3.6 - 5.1 g/dL - - -  AST 10 - 35 U/L 42(H) 66(H) 70(H)  ALT 6 - 29 U/L 34(H) 57(H) 56(H)  Alk Phosphatase 33 - 130 U/L - - -  Total Bilirubin 0.2 - 1.2 mg/dL 0.4 0.5 0.5  Bilirubin, Direct 0.0 - 0.2 mg/dL 0.1 0.2 0.2    Lab data from 08/30/2021  WBC 3.4 H&H 13.2 and 39.7 Platelet count 154K   Glucose 87 BUN 39 creatinine 0.91 Serum sodium 138, potassium 3.9, chloride 100, 2 CO2 23 Serum calcium 9.8 Bilirubin 0.3, AP 116, AST 44(0-40), ALT 27, total protein 7.3 and albumin 4.0  TSH 3.870  Cholesterol 164 HDL 49 LDL 101 VLDL 14 Triglycerides 75  Vitamin D2 50.6  Abdominal ultrasound with elastography performed on 08/28/2021 Heterogeneous liver with increased echotexture compatible with fatty liver or intrinsic liver disease.  Nodular contour suggestive of cirrhosis.  Mild splenomegaly but no ascites.  Elastography reveals median kPa score of 4.9  Assessment:  #1.  Primary biliary cholangitis.  She was diagnosed 17 years ago.  Liver biopsy at the time revealed stage II disease.  Recent imaging studies suggest progressive disease.  Recent ultrasound suggests contour changes suggestive of cirrhosis but elastography reveals median kPa score of 4.9 which would argue against fibrosis.  #2.  Hepatic fibrosis.  Risk factors include primary biliary cholangitis of over 17 years but she probably had disease longer than that.  Other risk factors include fatty liver.  First elastography in May 2016 revealed F3 and F4 disease but the second study in September 2020 revealed F2 and some F3 disease but current study is  not consistent with fibrosis.  Some degree of hepatic fibrosis as very likely.  If 1 was to believe elastography results her fibrosis score has improved.  She has normal hepatic function. ALT has normalized with weight loss.  AST is borderline. Would consider EGD next year when she has colonoscopy to make sure she does not have esophageal varices.   Plan:  LFTs in February 2023. Esophagogastroduodenoscopy and colonoscopy to be scheduled in February 2023. Office visit in 1 year.

## 2021-09-13 ENCOUNTER — Encounter (INDEPENDENT_AMBULATORY_CARE_PROVIDER_SITE_OTHER): Payer: Self-pay

## 2021-09-13 ENCOUNTER — Ambulatory Visit (INDEPENDENT_AMBULATORY_CARE_PROVIDER_SITE_OTHER): Payer: Medicare HMO | Admitting: Internal Medicine

## 2021-09-18 DIAGNOSIS — M71332 Other bursal cyst, left wrist: Secondary | ICD-10-CM | POA: Diagnosis not present

## 2021-09-18 DIAGNOSIS — M778 Other enthesopathies, not elsewhere classified: Secondary | ICD-10-CM | POA: Diagnosis not present

## 2021-09-27 ENCOUNTER — Other Ambulatory Visit: Payer: Self-pay

## 2021-09-27 ENCOUNTER — Ambulatory Visit (HOSPITAL_COMMUNITY): Payer: Medicare HMO | Attending: Orthopedic Surgery

## 2021-09-27 ENCOUNTER — Encounter (HOSPITAL_COMMUNITY): Payer: Self-pay

## 2021-09-27 DIAGNOSIS — M25612 Stiffness of left shoulder, not elsewhere classified: Secondary | ICD-10-CM | POA: Insufficient documentation

## 2021-09-27 DIAGNOSIS — M25512 Pain in left shoulder: Secondary | ICD-10-CM | POA: Insufficient documentation

## 2021-09-27 DIAGNOSIS — R29898 Other symptoms and signs involving the musculoskeletal system: Secondary | ICD-10-CM | POA: Insufficient documentation

## 2021-09-27 NOTE — Patient Instructions (Signed)
Complete the following exercises 1-2 times a day.  Doorway Stretch  Place each hand opposite each other on the doorway. (You can change where you feel the stretch by moving arms higher or lower.) Step through with one foot and bend front knee until a stretch is felt and hold. Step through with the opposite foot on the next rep. Hold for __20-30___ seconds. Repeat __2__times.       Internal Rotation Across Back  Grab the end of a towel with your affected side, palm facing backwards. Grab the towel with your unaffected side and pull your affected hand across your back until you feel a stretch in the front of your shoulder. If you feel pain, pull just to the pain, do not pull through the pain. Hold. Return your affected arm to your side. Try to keep your hand/arm close to your body during the entire movement.     Hold for 20-30 seconds. Complete 2 times.          Wall Flexion  Slide your arm up the wall or door frame until a stretch is felt in your shoulder . Hold for 20-30 seconds. Complete 2 times     Shoulder Abduction Stretch  Stand side ways by a wall with affected up on wall. Gently step in toward wall to feel stretch. Hold for 20-30 seconds. Complete 2 times.

## 2021-09-28 NOTE — Therapy (Signed)
[facility] [address] [place], [place], [address] Phone: [phone]   Fax:  [phone]  Occupational Therapy Evaluation  Patient Details  Name: Kathryn Ramos MRN: [id number] Date of Birth: 1956/02/10 Referring Provider (OT): Elsie Saas, MD   Encounter Date: 09/27/2021   OT End of Session - 09/28/21 0044     Visit Number 1    Number of Visits 6    Date for OT Re-Evaluation 11/09/21    Authorization Type [employer] Medicare    Authorization Time Period $35 copay, no visit limit, no authorization needed    Progress Note Due on Visit 10    OT Start Time 1735    OT Stop Time 1830    OT Time Calculation (min) 55 min    Activity Tolerance Patient tolerated treatment well    Behavior During Therapy [facility] for tasks assessed/performed             Past Medical History:  Diagnosis Date   Degeneration, intervertebral disc    Migraine 2009   Osteoarthritis    Primary biliary cirrhosis ([place]) 2003    Past Surgical History:  Procedure Laterality Date   COLONOSCOPY  03/18/2012   Procedure: COLONOSCOPY;  Surgeon: Rogene Houston, MD;  Location[facility];  Service: Endoscopy;  Laterality: N/A;  930   colonscopy  2002   DILATION AND CURETTAGE OF UTERUS  1978   ENDOMETRIAL FULGURATION  2005   of a cyst   LIVER BIOPSY  2003   TUBAL LIGATION  1995    There were no vitals filed for this visit.   Subjective Assessment - 09/27/21 1739     Subjective  S: It feels a lot better since the injection.    Pertinent History Patient is a 65 y/o female S/P left shoulder tendonitis/adhesive capsulitis which started approximately 6 weeks ago. Dr. Noemi Chapel has referred patient to occupational therapy for evaluation and treatment.    Patient Stated Goals To have decreased pain.    Currently in Pain? Yes   0/10 at rest   Pain Score 7     Pain Location Shoulder    Pain Orientation Left    Pain Descriptors / Indicators Sharp    Pain Type Acute pain     Pain Onset More than a month ago    Pain Frequency Occasional    Aggravating Factors  certain movements and also reaching behind her back    Pain Relieving Factors injection helped, pain medication    Effect of Pain on Daily Activities moderate effect (since the injection)               [facility] OT Assessment - 09/27/21 1743       Assessment   Medical Diagnosis left adhesive capsulitis/tendonitis    Referring Provider (OT) Elsie Saas, MD    Onset Date/Surgical Date --   at least 6 weeks ago   Hand Dominance Right    Next MD Visit N/A   return if needed   Prior Therapy None      Precautions   Precautions None      Restrictions   Weight Bearing Restrictions No      Balance Screen   Has the patient fallen in the past 6 months Yes    How many times? 1    Has the patient had a decrease in activity level because of a fear of falling?  No    Is the patient reluctant to leave  their home because of a fear of falling?  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      Mobility   Mobility Status Independent      Vision - History   Baseline Vision No visual deficits      Observation/Other Assessments   Focus on Therapeutic Outcomes (FOTO)  59/100      ROM / Strength   AROM / PROM / Strength Strength;PROM;AROM      AROM   Overall AROM Comments Assessed seated. IR/er abducted    AROM Assessment Site Shoulder    Right/Left Shoulder Left    Left Shoulder Flexion 136 Degrees    Left Shoulder ABduction 135 Degrees    Left Shoulder Internal Rotation 20 Degrees    Left Shoulder External  Rotation 60 Degrees      PROM   Overall PROM Comments Assessed supine. IR/er abducted.    PROM Assessment Site Shoulder    Right/Left Shoulder Left    Left Shoulder Flexion 165 Degrees    Left Shoulder ABduction 180 Degrees    Left Shoulder Internal Rotation 85 Degrees    Left Shoulder External Rotation 65 Degrees      Strength   Overall Strength Comments Assessed seated. IR/er abducted    Strength Assessment Site Shoulder    Right/Left Shoulder Left    Left Shoulder Flexion 4-/5    Left Shoulder ABduction 4+/5    Left Shoulder Internal Rotation 4-/5    Left Shoulder External Rotation 5/5                              OT Education - 09/28/21 0043     Education Details shoulder stretches    Person(s) Educated Patient    Methods Explanation;Demonstration;Verbal cues;Handout    Comprehension Returned demonstration;Verbalized understanding              OT Short Term Goals - 09/28/21 0054       OT SHORT TERM GOAL #1   Title Patient will be independent with HEP in order to faciliate her progress in therapy and allow her to return to using her LUE as her non dominant extremity for 75% or more of her daily tasks.    Time 6    Period Weeks    Status New    Target Date 11/09/21      OT SHORT TERM GOAL #2   Title patient will increase her LUE A/ROM by 10 degrees or more where lacking in order to be able to reach behind her back during dressing tasks and brush and wash her hair.    Time 6    Period Weeks    Status New      OT SHORT TERM GOAL #3   Title Pt will increase her LUE strength to 4+/5 in order to return to lifting items of moderate weight while using her right UE as an assist with less pain noted.    Time 6    Period Weeks  Status New      OT SHORT TERM GOAL #4   Title Patient will report a decrease in pain when using her LUE during daily and work related tasks of approximately 4/10 or less.    Time 6    Period Weeks    Status New                       Plan - 09/28/21 0046     OT Occupational Profile and History Problem Focused Assessment - Including review of records relating to presenting problem    Occupational performance deficits (Please refer to evaluation for details): ADL's;IADL's;Rest and Sleep;Work;Leisure    Body Structure / Function / Physical Skills ADL;Strength;Pain;ROM    Rehab Potential Good    Clinical Decision Making Limited treatment options, no task modification necessary    Comorbidities Affecting Occupational Performance: May have comorbidities impacting occupational performance    Modification or Assistance to Complete Evaluation  No modification of tasks or assist necessary to complete eval    OT Frequency 1x / week    OT Duration 6 weeks    OT Treatment/Interventions Self-care/ADL training;DME and/or AE instruction;Therapeutic activities;Ultrasound;Therapeutic exercise;Cryotherapy;Electrical Stimulation;Energy conservation;Manual Therapy;Patient/family education;Passive range of motion;Neuromuscular education    Plan P; patient will benefit from skilled OT services to increase functional performance during daily, leisure, and work related tasks using her left UE as her non-dominant extremity. Treatment plan: Myofascial release if needed. passive strething, AA/ROM, A/ROM, general strengthening. Modaltiies PRN. Note: Pt reports that she is only able to attend 5:30P appointments. As this time is not one that is available typically, next session assess progress. Determine if more appointments will be needed.    OT Home Exercise Plan eval: shoulder stretches    Consulted and Agree with Plan of Care Patient             Patient will benefit from skilled therapeutic intervention in order to improve the following deficits and impairments:   Body Structure / Function / Physical Skills: ADL, Strength, Pain, ROM       Visit Diagnosis: Other symptoms and signs involving the musculoskeletal  system - Plan: Ot plan of care cert/re-cert  Stiffness of left shoulder, not elsewhere classified - Plan: Ot plan of care cert/re-cert  Acute pain of left shoulder - Plan: Ot plan of care cert/re-cert    Problem List Patient Active Problem List   Diagnosis Date Noted   Hepatic fibrosis 09/11/2021   DJD (degenerative joint disease) of lumbar spine 09/13/2013   Obesity (BMI 30-39.9) 09/13/2013   Insomnia 09/13/2013   Polyclonal gammopathy 07/15/2013   Primary biliary cholangitis ([place]) 07/06/2012    Ailene Ravel, OTR/L,CBIS  [phone]  09/28/2021, 1:00 AM  [facility] [place], [place], [address] Phone: [phone]   Fax:  [phone]  Name: Kathryn Ramos MRN: [id number] Date of Birth: September 29, 1956

## 2021-10-09 DIAGNOSIS — M6740 Ganglion, unspecified site: Secondary | ICD-10-CM | POA: Diagnosis not present

## 2021-10-09 DIAGNOSIS — M25532 Pain in left wrist: Secondary | ICD-10-CM | POA: Diagnosis not present

## 2021-10-09 DIAGNOSIS — M19031 Primary osteoarthritis, right wrist: Secondary | ICD-10-CM | POA: Diagnosis not present

## 2021-10-09 DIAGNOSIS — M25831 Other specified joint disorders, right wrist: Secondary | ICD-10-CM | POA: Diagnosis not present

## 2021-10-09 DIAGNOSIS — M25531 Pain in right wrist: Secondary | ICD-10-CM | POA: Diagnosis not present

## 2021-10-09 DIAGNOSIS — M1811 Unilateral primary osteoarthritis of first carpometacarpal joint, right hand: Secondary | ICD-10-CM | POA: Diagnosis not present

## 2021-10-16 ENCOUNTER — Encounter (HOSPITAL_COMMUNITY): Payer: Self-pay

## 2021-10-16 ENCOUNTER — Ambulatory Visit (HOSPITAL_COMMUNITY): Payer: Medicare HMO | Attending: Orthopedic Surgery

## 2021-10-16 ENCOUNTER — Other Ambulatory Visit: Payer: Self-pay

## 2021-10-16 DIAGNOSIS — M25512 Pain in left shoulder: Secondary | ICD-10-CM | POA: Insufficient documentation

## 2021-10-16 DIAGNOSIS — R29898 Other symptoms and signs involving the musculoskeletal system: Secondary | ICD-10-CM | POA: Insufficient documentation

## 2021-10-16 DIAGNOSIS — M25612 Stiffness of left shoulder, not elsewhere classified: Secondary | ICD-10-CM | POA: Diagnosis not present

## 2021-10-16 NOTE — Therapy (Signed)
[facility] [address] [place], [place], [address] Phone: [phone]   Fax:  [phone]  Occupational Therapy Treatment Discharge summary Patient Details  Name: Kathryn Ramos MRN: [id number] Date of Birth: [birth year] Referring Provider (OT): Elsie Saas, MD   Encounter Date: 10/16/2021   OT End of Session - 10/16/21 1805     Visit Number 2    Number of Visits 6        Authorization Type [employer] Medicare    Authorization Time Period $35 copay, no visit limit, no authorization needed    Progress Note Due on Visit 10    OT Start Time 1735   reassess and discharge   OT Stop Time 1800    OT Time Calculation (min) 25 min    Activity Tolerance Patient tolerated treatment well    Behavior During Therapy [facility] for tasks assessed/performed             Past Medical History:  Diagnosis Date   Degeneration, intervertebral disc    Migraine 2009   Osteoarthritis    Primary biliary cirrhosis ([place]) 2003    Past Surgical History:  Procedure Laterality Date   COLONOSCOPY  03/18/2012   Procedure: COLONOSCOPY;  Surgeon: Rogene Houston, MD;  [facility];  Service: Endoscopy;  Laterality: N/A;  930   colonscopy  2002   DILATION AND CURETTAGE OF UTERUS  1978   ENDOMETRIAL FULGURATION  2005   of a cyst   LIVER BIOPSY  2003   TUBAL LIGATION  1995    There were no vitals filed for this visit.   Subjective Assessment - 10/16/21 1738     Subjective  S: It's doing a lot better.    Currently in Pain? No/denies                [facility] OT Assessment - 10/16/21 1739       Assessment   Medical Diagnosis left adhesive capsulitis/tendonitis      Precautions   Precautions None      AROM   Overall AROM Comments Assessed seated. IR/er abducted    AROM Assessment Site Shoulder    Right/Left Shoulder Left    Left Shoulder Flexion 145 Degrees   previous: 136   Left Shoulder ABduction 151 Degrees   previous: 135   Left  Shoulder Internal Rotation 51 Degrees   previous: 20   Left Shoulder External Rotation 75 Degrees   previous: 60     PROM   Overall PROM  Within functional limits for tasks performed    Overall PROM Comments Assessed supine. IR/er abducted.    PROM Assessment Site Shoulder    Right/Left Shoulder Left      Strength   Overall Strength Comments Assessed seated. IR/er abducted    Strength Assessment Site Shoulder    Right/Left Shoulder Left    Left Shoulder Flexion 5/5   previous: 4-/5   Left Shoulder ABduction 5/5   previous: 4+/5   Left Shoulder Internal Rotation 5/5   previous: 4-/5   Left Shoulder External Rotation 5/5   previous: 5/5                             OT Education - 10/16/21 1804     Education Details upgraded internal rotation stretch. ball pass behind back. stargazer stretch    Person(s) Educated Patient    Methods Explanation;Demonstration;Verbal cues;Handout  Comprehension Returned demonstration;Verbalized understanding              OT Short Term Goals - 10/16/21 1811       OT SHORT TERM GOAL #1   Title Patient will be independent with HEP in order to faciliate her progress in therapy and allow her to return to using her LUE as her non dominant extremity for 75% or more of her daily tasks.    Time 6    Period Weeks    Status Achieved    Target Date 11/09/21      OT SHORT TERM GOAL #2   Title patient will increase her LUE A/ROM by 10 degrees or more where lacking in order to be able to reach behind her back during dressing tasks and brush and wash her hair.    Time 6    Period Weeks    Status Achieved      OT SHORT TERM GOAL #3   Title Pt will increase her LUE strength to 4+/5 in order to return to lifting items of moderate weight while using her right UE as an assist with less pain noted.    Time 6    Period Weeks    Status Achieved      OT SHORT TERM GOAL #4   Title Patient will report a decrease in pain when using her LUE  during daily and work related tasks of approximately 4/10 or less.    Time 6    Period Weeks    Status Achieved                      Plan - 10/16/21 1806     Clinical Impression Statement A: Reassessment completed this date. patient has increased her A/ROM measurement since initial evaluation. Left shoulder strength is 5/5 in all ranges. Reveiwed HEP and added a progression to internal rotation. VC for form and technique were provided. All education has been completed. Pt is ready for discharge.    Body Structure / Function / Physical Skills ADL;Strength;Pain;ROM    Plan P: D/C from OT services with HEP. Follow up with MD if needed.    OT Home Exercise Plan eval: shoulder stretches 11/8: internal rotation stretch - vertical, ball pass, stargazer stretch    Consulted and Agree with Plan of Care Patient             Patient will benefit from skilled therapeutic intervention in order to improve the following deficits and impairments:   Body Structure / Function / Physical Skills: ADL, Strength, Pain, ROM       Visit Diagnosis: Other symptoms and signs involving the musculoskeletal system  Acute pain of left shoulder  Stiffness of left shoulder, not elsewhere classified    Problem List Patient Active Problem List   Diagnosis Date Noted   Hepatic fibrosis 09/11/2021   DJD (degenerative joint disease) of lumbar spine 09/13/2013   Obesity (BMI 30-39.9) 09/13/2013   Insomnia 09/13/2013   Polyclonal gammopathy 07/15/2013   Primary biliary cholangitis ([place]) 07/06/2012    OCCUPATIONAL THERAPY DISCHARGE SUMMARY  Visits from Start of Care: 2  Current functional level related to goals / functional outcomes: See above   Remaining deficits: See above   Education / Equipment: See above   Patient agrees to discharge. Patient goals were met. Patient is being discharged due to meeting the stated rehab goals.Ailene Ravel, OTR/L,CBIS   [phone]  10/16/2021, 6:12 PM  [facility] [place], [place], [address] Phone: [phone]   Fax:  [phone]  Name: Kathryn Ramos MRN: [id number] Date of Birth: 02-13-1956

## 2021-10-16 NOTE — Patient Instructions (Signed)
Add the following the stretch to upgrade your previous towel stretch.   Internal Rotation Towel Shoulder Stretch  Put towel over unaffected shoulder. Grab towel behind back with affected arm. Pull towel up and forward on unaffected side to achieve a stretch in the affected shoulder. Hold for 30 seconds - 1 minute. Complete 2 times.     Pass a ball behind your back. 10 time each direction.

## 2021-10-19 ENCOUNTER — Encounter (INDEPENDENT_AMBULATORY_CARE_PROVIDER_SITE_OTHER): Payer: Self-pay

## 2021-10-26 ENCOUNTER — Other Ambulatory Visit: Payer: Self-pay

## 2021-10-26 DIAGNOSIS — R2232 Localized swelling, mass and lump, left upper limb: Secondary | ICD-10-CM | POA: Diagnosis not present

## 2021-11-09 ENCOUNTER — Encounter (INDEPENDENT_AMBULATORY_CARE_PROVIDER_SITE_OTHER): Payer: Self-pay

## 2021-11-19 DIAGNOSIS — Z1231 Encounter for screening mammogram for malignant neoplasm of breast: Secondary | ICD-10-CM | POA: Diagnosis not present

## 2021-11-21 DIAGNOSIS — R5383 Other fatigue: Secondary | ICD-10-CM | POA: Diagnosis not present

## 2021-11-21 DIAGNOSIS — Z6830 Body mass index (BMI) 30.0-30.9, adult: Secondary | ICD-10-CM | POA: Diagnosis not present

## 2021-11-21 DIAGNOSIS — K745 Biliary cirrhosis, unspecified: Secondary | ICD-10-CM | POA: Diagnosis not present

## 2021-11-21 DIAGNOSIS — I10 Essential (primary) hypertension: Secondary | ICD-10-CM | POA: Diagnosis not present

## 2021-11-21 DIAGNOSIS — Z299 Encounter for prophylactic measures, unspecified: Secondary | ICD-10-CM | POA: Diagnosis not present

## 2021-12-07 DIAGNOSIS — R42 Dizziness and giddiness: Secondary | ICD-10-CM | POA: Diagnosis not present

## 2022-01-02 ENCOUNTER — Other Ambulatory Visit (INDEPENDENT_AMBULATORY_CARE_PROVIDER_SITE_OTHER): Payer: Self-pay | Admitting: *Deleted

## 2022-01-02 DIAGNOSIS — K743 Primary biliary cirrhosis: Secondary | ICD-10-CM

## 2022-01-08 DIAGNOSIS — R42 Dizziness and giddiness: Secondary | ICD-10-CM | POA: Diagnosis not present

## 2022-01-08 DIAGNOSIS — I10 Essential (primary) hypertension: Secondary | ICD-10-CM | POA: Diagnosis not present

## 2022-01-11 ENCOUNTER — Other Ambulatory Visit: Payer: Self-pay

## 2022-01-11 DIAGNOSIS — M67431 Ganglion, right wrist: Secondary | ICD-10-CM | POA: Diagnosis not present

## 2022-01-30 DIAGNOSIS — K743 Primary biliary cirrhosis: Secondary | ICD-10-CM | POA: Diagnosis not present

## 2022-01-30 LAB — HEPATIC FUNCTION PANEL
AG Ratio: 1.2 (calc) (ref 1.0–2.5)
ALT: 21 U/L (ref 6–29)
AST: 30 U/L (ref 10–35)
Albumin: 3.9 g/dL (ref 3.6–5.1)
Alkaline phosphatase (APISO): 105 U/L (ref 37–153)
Bilirubin, Direct: 0.1 mg/dL (ref 0.0–0.2)
Globulin: 3.2 g/dL (ref 1.9–3.7)
Indirect Bilirubin: 0.3 mg/dL (ref 0.2–1.2)
Total Bilirubin: 0.4 mg/dL (ref 0.2–1.2)
Total Protein: 7.1 g/dL (ref 6.1–8.1)

## 2022-02-01 IMAGING — US US ABDOMEN COMPLETE W/ ELASTOGRAPHY
1 series · 12 of 24 positions shown · non-contrast
Comparison: 08/17/2019

CLINICAL DATA: Primary biliary cirrhosis

EXAM:
ULTRASOUND ABDOMEN
ULTRASOUND HEPATIC ELASTOGRAPHY
TECHNIQUE: Sonography of the upper abdomen was performed. In addition,
ultrasound elastography evaluation of the liver was performed. A
region of interest was placed within the right lobe of the liver.
Following application of a compressive sonographic pulse, tissue
compressibility was assessed. Multiple assessments were performed at
the selected site. Median tissue compressibility was determined.
Previously, hepatic stiffness was assessed by shear wave velocity.
Based on recently published Society of Radiologists in Ultrasound
consensus article, reporting is now recommended to be performed in
the SI units of pressure (kiloPascals) representing hepatic
stiffness/elasticity. The obtained result is compared to the
published reference standards. (cACLD = compensated Advanced Chronic
Liver Disease)

[Series 1: us abdomen complete w/elastography · 12 of 24 slices shown]
[im 2/24]
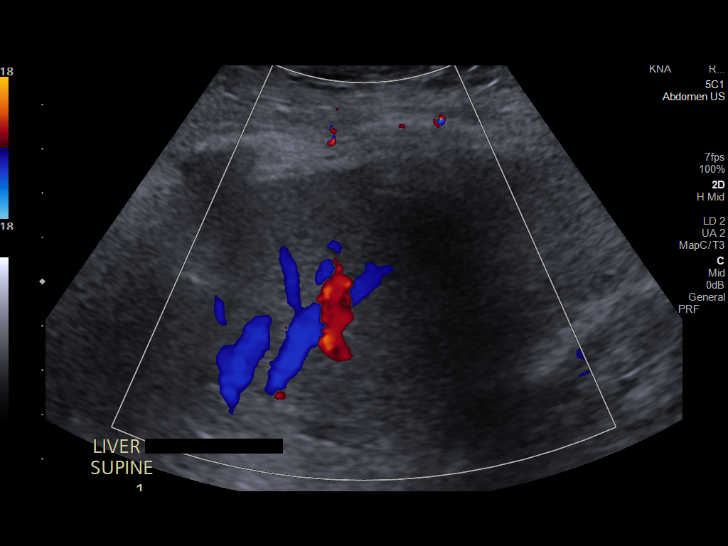
[im 4/24]
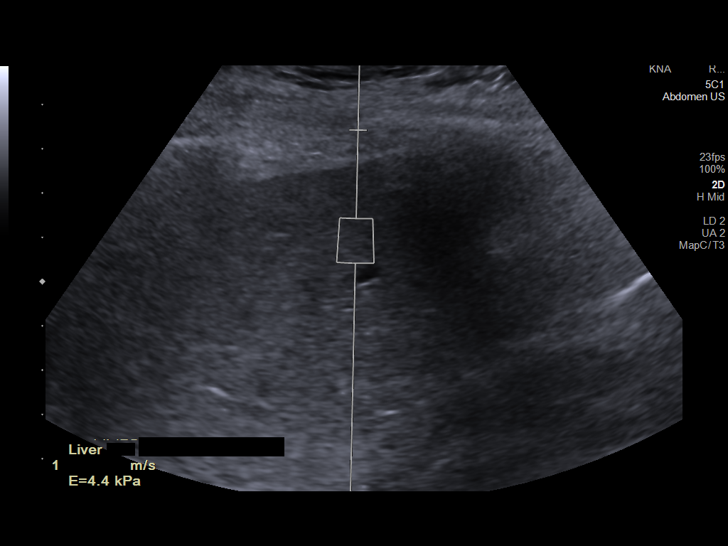
[im 6/24]
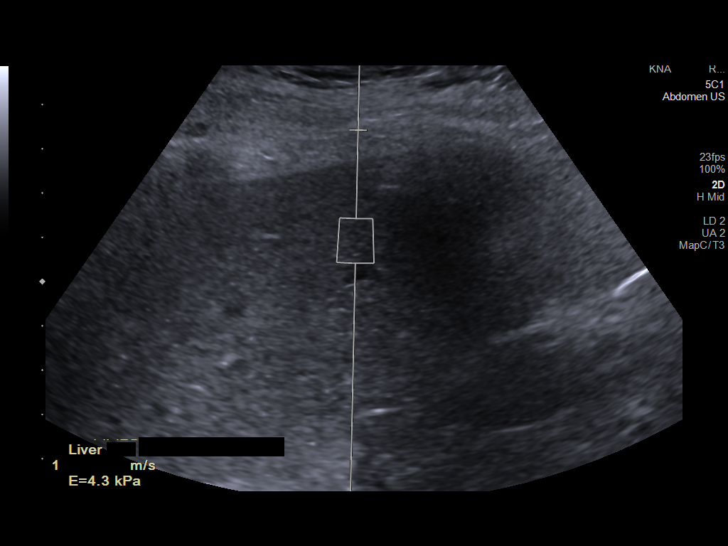
[im 8/24]
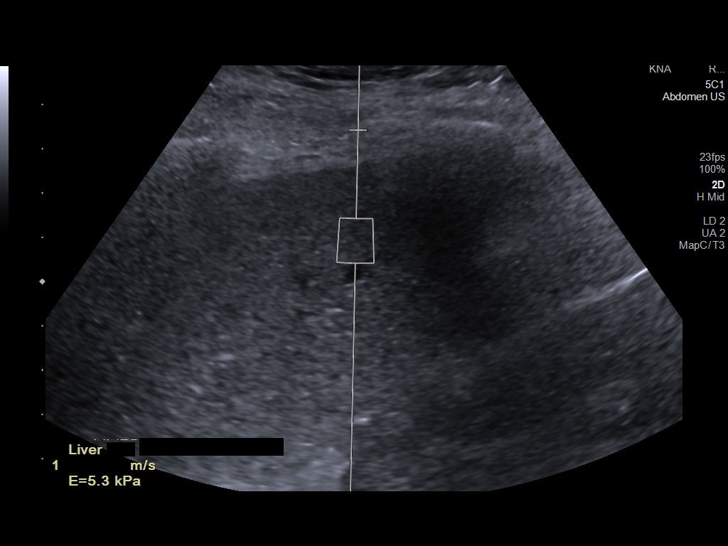
[im 10/24]
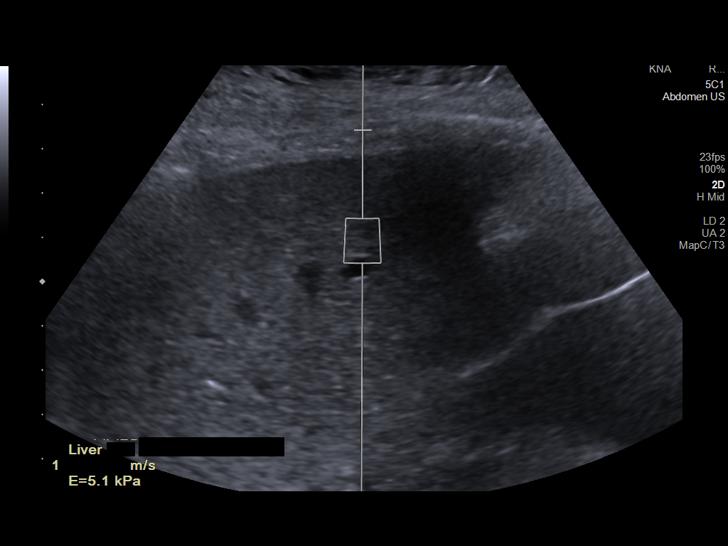
[im 12/24]
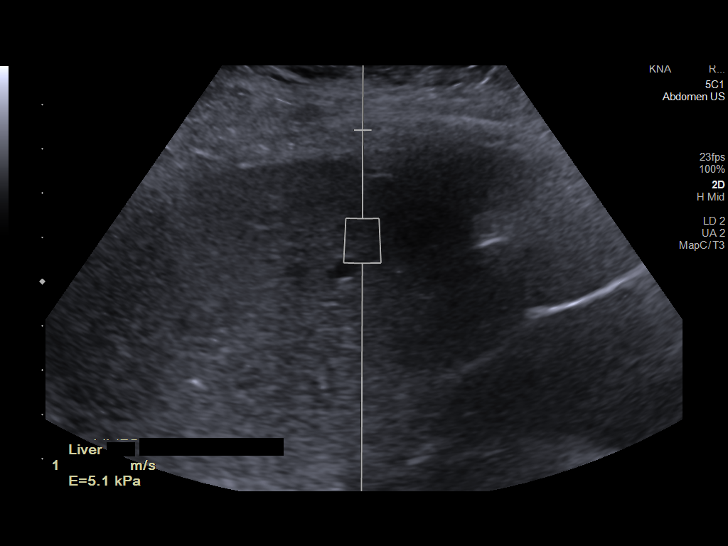
[im 14/24]
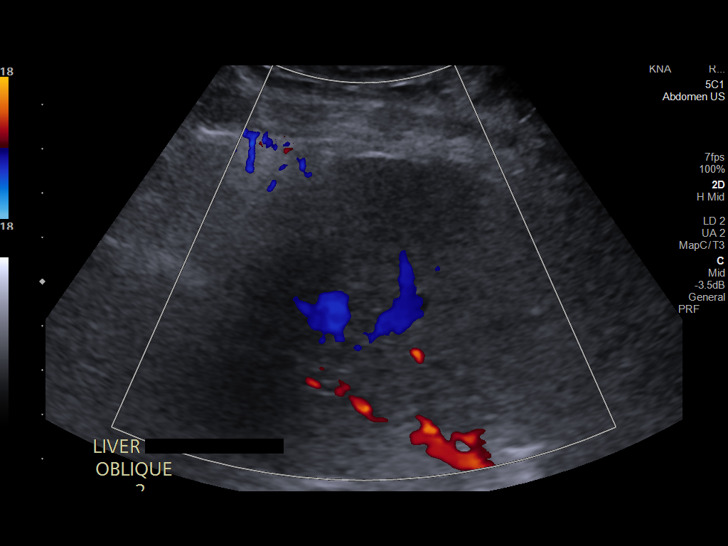
[im 16/24]
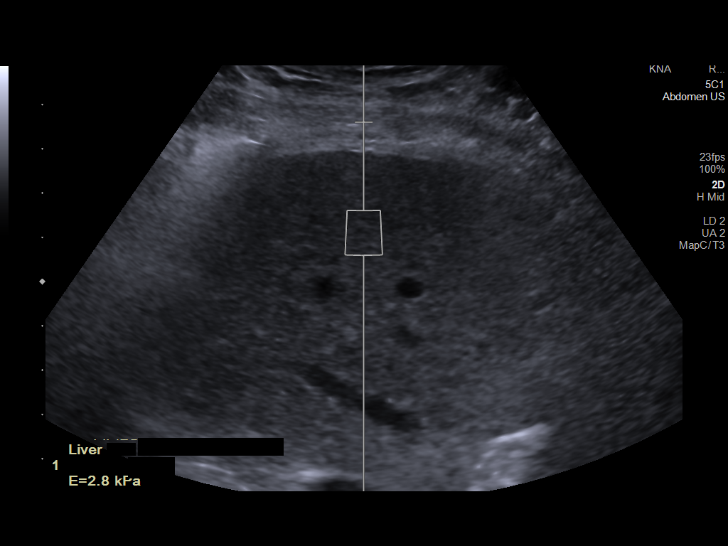
[im 18/24]
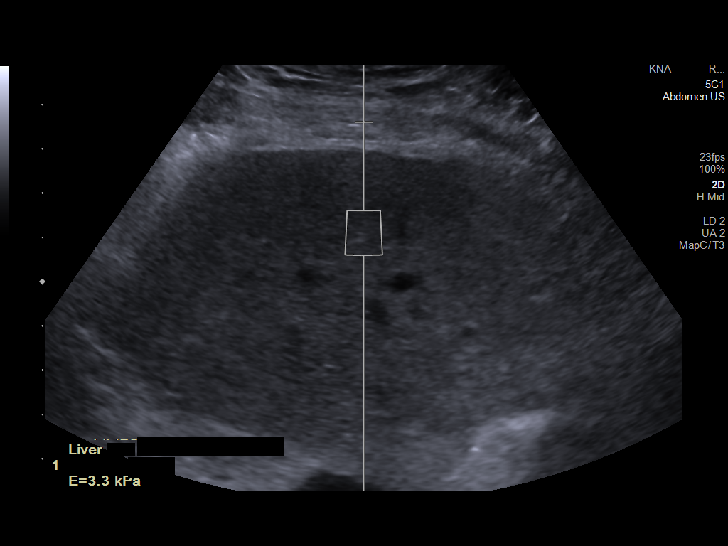
[im 20/24]
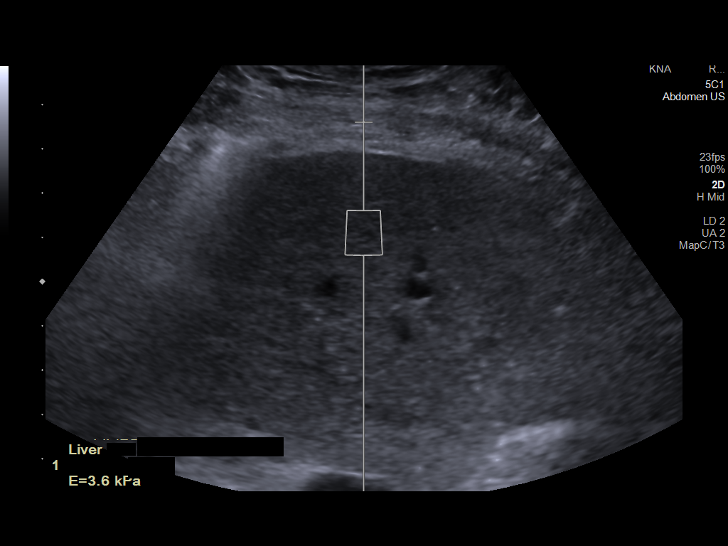
[im 22/24]
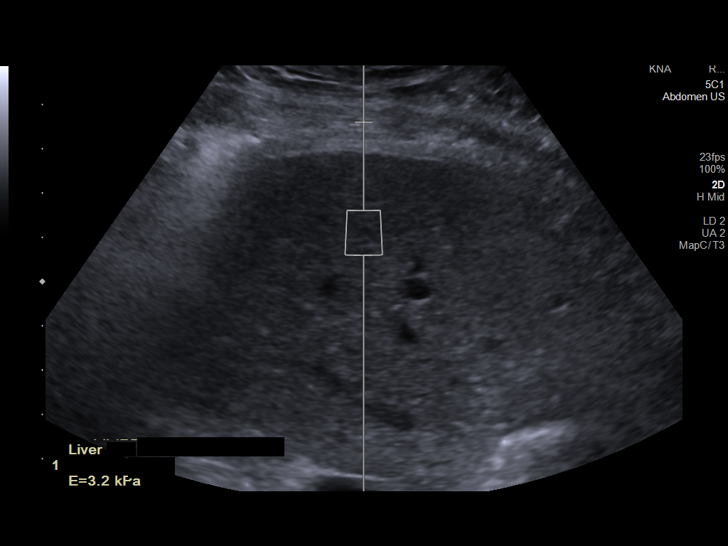
[im 24/24]
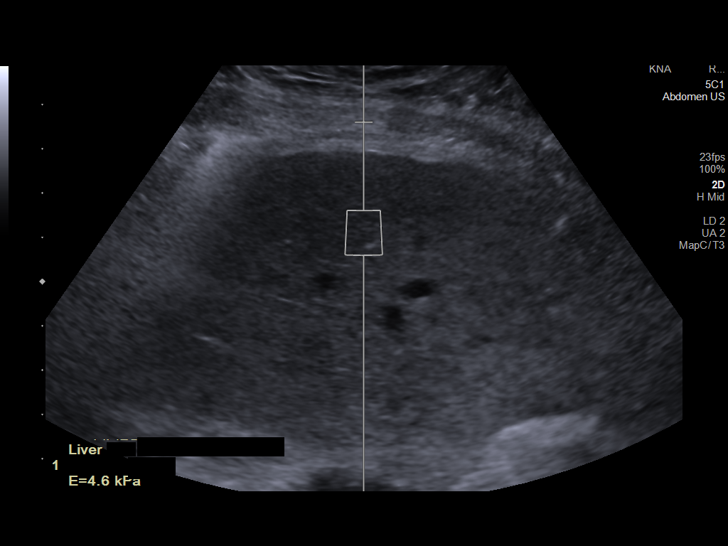

[12 of 24 positions shown; findings below may reference images not displayed]

FINDINGS: ULTRASOUND ABDOMEN

Gallbladder: No gallstones or wall thickening visualized. No
sonographic Murphy sign noted by sonographer.

Common bile duct: Diameter: Normal caliber, 3 mm

Liver: Mildly increased echotexture diffusely. Nodular contours of
the liver compatible with cirrhosis. No focal hepatic abnormality.
Portal vein is patent on color Doppler imaging with normal direction
of blood flow towards the liver.

IVC: No abnormality visualized.

Pancreas: Visualized portion unremarkable.

Spleen: Mild splenomegaly with a craniocaudal length of 13.6 cm and
a splenic volume of 516 mL.

Right Kidney: Length: 9.1 cm. Echogenicity within normal limits. No
mass or hydronephrosis visualized.

Left Kidney: Length: 11.3 cm. Echogenicity within normal limits. No
mass or hydronephrosis visualized.

Abdominal aorta: No aneurysm visualized.

Other findings: None.

ULTRASOUND HEPATIC ELASTOGRAPHY

Device: Siemens Helix VTQ

Patient position: Supine

Transducer 5C1

Number of measurements: 10

Hepatic segment:  8

Median kPa:

IQR: 1

IQR/Median kPa ratio:

Data quality:  Good

Diagnostic category:  < or = 5 kPa: high probability of being normal

The use of hepatic elastography is applicable to patients with viral
hepatitis and non-alcoholic fatty liver disease. At this time, there
is insufficient data for the referenced cut-off values and use in
other causes of liver disease, including alcoholic liver disease.
Patients, however, may be assessed by elastography and serve as
their own reference standard/baseline.

In patients with non-alcoholic liver disease, the values suggesting
compensated advanced chronic liver disease (cACLD) may be lower, and
patients may need additional testing with elasticity results of [DATE]
kPa.

Please note that abnormal hepatic elasticity and shear wave
velocities may also be identified in clinical settings other than
with hepatic fibrosis, such as: acute hepatitis, elevated right
heart and central venous pressures including use of beta blockers,
Tendai disease (Fanou), infiltrative processes such as
mastocytosis/amyloidosis/infiltrative tumor/lymphoma, extrahepatic
cholestasis, with hyperemia in the post-prandial state, and with
liver transplantation. Correlation with patient history, laboratory
data, and clinical condition recommended.

Diagnostic Categories:

< or =5 kPa: high probability of being normal

< or =9 kPa: in the absence of other known clinical signs, rules [DATE] kPa and ?13 kPa: suggestive of cACLD, but needs further testing

>13 kPa: highly suggestive of cACLD

> or =17 kPa: highly suggestive of cACLD with an increased
probability of clinically significant portal hypertension
IMPRESSION: ULTRASOUND ABDOMEN:

Heterogeneous, increased echotexture compatible with fatty
infiltration or intrinsic liver disease. Nodular contours compatible
with cirrhosis. No focal hepatic abnormality.

Splenomegaly.

ULTRASOUND HEPATIC ELASTOGRAPHY:

Median kPa:

Diagnostic category:  < or = 5 kPa: high probability of being normal

## 2022-02-27 DIAGNOSIS — Z299 Encounter for prophylactic measures, unspecified: Secondary | ICD-10-CM | POA: Diagnosis not present

## 2022-02-27 DIAGNOSIS — I10 Essential (primary) hypertension: Secondary | ICD-10-CM | POA: Diagnosis not present

## 2022-02-27 DIAGNOSIS — Z789 Other specified health status: Secondary | ICD-10-CM | POA: Diagnosis not present

## 2022-02-27 DIAGNOSIS — K745 Biliary cirrhosis, unspecified: Secondary | ICD-10-CM | POA: Diagnosis not present

## 2022-02-27 DIAGNOSIS — R350 Frequency of micturition: Secondary | ICD-10-CM | POA: Diagnosis not present

## 2022-02-27 DIAGNOSIS — N904 Leukoplakia of vulva: Secondary | ICD-10-CM | POA: Diagnosis not present

## 2022-03-04 ENCOUNTER — Encounter (INDEPENDENT_AMBULATORY_CARE_PROVIDER_SITE_OTHER): Payer: Self-pay | Admitting: *Deleted

## 2022-03-13 DIAGNOSIS — R350 Frequency of micturition: Secondary | ICD-10-CM | POA: Diagnosis not present

## 2022-03-13 DIAGNOSIS — Z789 Other specified health status: Secondary | ICD-10-CM | POA: Diagnosis not present

## 2022-03-13 DIAGNOSIS — Z299 Encounter for prophylactic measures, unspecified: Secondary | ICD-10-CM | POA: Diagnosis not present

## 2022-03-13 DIAGNOSIS — N904 Leukoplakia of vulva: Secondary | ICD-10-CM | POA: Diagnosis not present

## 2022-03-13 DIAGNOSIS — E7800 Pure hypercholesterolemia, unspecified: Secondary | ICD-10-CM | POA: Diagnosis not present

## 2022-03-13 DIAGNOSIS — I10 Essential (primary) hypertension: Secondary | ICD-10-CM | POA: Diagnosis not present

## 2022-03-27 ENCOUNTER — Telehealth (INDEPENDENT_AMBULATORY_CARE_PROVIDER_SITE_OTHER): Payer: Self-pay | Admitting: *Deleted

## 2022-03-27 NOTE — Telephone Encounter (Signed)
Does not feel like she is emptying when having a bowel movement. Having a faint hint of pain on left side. Feels tight around navel. Concerned about it may be a hernia.  ? ?Has EGD and TCS scheduled for May 24th.  ?

## 2022-03-28 ENCOUNTER — Other Ambulatory Visit (INDEPENDENT_AMBULATORY_CARE_PROVIDER_SITE_OTHER): Payer: Self-pay

## 2022-03-28 DIAGNOSIS — Z1211 Encounter for screening for malignant neoplasm of colon: Secondary | ICD-10-CM

## 2022-03-29 ENCOUNTER — Telehealth (INDEPENDENT_AMBULATORY_CARE_PROVIDER_SITE_OTHER): Payer: Self-pay | Admitting: *Deleted

## 2022-03-29 ENCOUNTER — Encounter (INDEPENDENT_AMBULATORY_CARE_PROVIDER_SITE_OTHER): Payer: Self-pay | Admitting: *Deleted

## 2022-03-29 MED ORDER — PEG 3350-KCL-NA BICARB-NACL 420 G PO SOLR: 4000.0000 mL | Freq: Once | ORAL | 0 refills | Status: AC

## 2022-03-29 NOTE — Telephone Encounter (Signed)
Per dr Laural Golden - if pain is worse then needs office visit first. Can do colace '200mg'$  qhs. I called and discussed with patient and she verbalized understanding.  ?

## 2022-03-29 NOTE — Telephone Encounter (Signed)
Patient needs trilyte 

## 2022-04-03 ENCOUNTER — Telehealth (INDEPENDENT_AMBULATORY_CARE_PROVIDER_SITE_OTHER): Payer: Self-pay | Admitting: *Deleted

## 2022-04-03 NOTE — Telephone Encounter (Signed)
Referring MD/PCP: hairfield ? ?Procedure: tcs ? ?Reason/Indication:  screening ? ?Has patient had this procedure before?  Yes, 03/2012 ? If so, when, by whom and where?   ? ?Is there a family history of colon cancer?  no ? Who?  What age when diagnosed?   ? ?Is patient diabetic? If yes, Type 1 or Type 2   no ?     ?Does patient have prosthetic heart valve or mechanical valve?  no ? ?Do you have a pacemaker/defibrillator?  no ? ?Has patient ever had endocarditis/atrial fibrillation? no ? ?Does patient use oxygen? no ? ?Has patient had joint replacement within last 12 months?  no ? ?Is patient constipated or do they take laxatives? no ? ?Does patient have a history of alcohol/drug use?  no ? ?Have you had a stroke/heart attack last 6 mths? no ? ?Do you take medicine for weight loss?  no ? ?For female patients,: have you had a hysterectomy no ?                     are you post menopausal  ?                     do you still have your menstrual cycle no ? ?Is patient on blood thinner such as Coumadin, Plavix and/or Aspirin? yes ? ?Medications: asa 81 mg daily, ursodiol 300 mg 4 daily, MVI daily, hctz 12.5 mg daily, fish oil 1200 mg daily, amlodipine 5 mg daily, tumeric 500 mg daily, ibuprofen 800 mg prn, vit d3 2000 iu daily,  famotidine 20 mg prn, ? ?Allergies: see epic ? ?Medication Adjustment per Dr Rehman/Dr Jenetta Downer asa 2 days ? ?Procedure date & time: 05/01/22 ? ? ?

## 2022-04-05 DIAGNOSIS — R102 Pelvic and perineal pain: Secondary | ICD-10-CM | POA: Diagnosis not present

## 2022-04-05 DIAGNOSIS — L900 Lichen sclerosus et atrophicus: Secondary | ICD-10-CM | POA: Diagnosis not present

## 2022-04-05 DIAGNOSIS — R140 Abdominal distension (gaseous): Secondary | ICD-10-CM | POA: Diagnosis not present

## 2022-04-07 DIAGNOSIS — R42 Dizziness and giddiness: Secondary | ICD-10-CM | POA: Diagnosis not present

## 2022-04-07 DIAGNOSIS — I10 Essential (primary) hypertension: Secondary | ICD-10-CM | POA: Diagnosis not present

## 2022-04-09 DIAGNOSIS — R102 Pelvic and perineal pain: Secondary | ICD-10-CM | POA: Diagnosis not present

## 2022-04-09 DIAGNOSIS — R140 Abdominal distension (gaseous): Secondary | ICD-10-CM | POA: Diagnosis not present

## 2022-04-09 DIAGNOSIS — R109 Unspecified abdominal pain: Secondary | ICD-10-CM | POA: Diagnosis not present

## 2022-04-23 DIAGNOSIS — Z6829 Body mass index (BMI) 29.0-29.9, adult: Secondary | ICD-10-CM | POA: Diagnosis not present

## 2022-04-23 DIAGNOSIS — L900 Lichen sclerosus et atrophicus: Secondary | ICD-10-CM | POA: Diagnosis not present

## 2022-04-25 NOTE — Patient Instructions (Signed)
Kathryn Ramos  04/25/2022     '@PREFPERIOPPHARMACY'$ @   Your procedure is scheduled on  05/01/2022.   Report to [facility] at  [facility].   Call this number if you have problems the morning of surgery:  [phone]   Remember:  Follow the diet and prep instructions given to you by the office.    Take these medicines the morning of surgery with A SIP OF WATER                         amlodipine, pepcid, actigall.     Do not wear jewelry, make-up or nail polish.  Do not wear lotions, powders, or perfumes, or deodorant.  Do not shave 48 hours prior to surgery.  Men may shave face and neck.  Do not bring valuables to the hospital.  [facility] is not responsible for any belongings or valuables.  Contacts, dentures or bridgework may not be worn into surgery.  Leave your suitcase in the car.  After surgery it may be brought to your room.  For patients admitted to the hospital, discharge time will be determined by your treatment team.  Patients discharged the day of surgery will not be allowed to drive home and must have someone with them for 24 hours.    Special instructions:   DO NOT smoke tobacco or vape for 24 hours before your procedure.  Please read over the following fact sheets that you were given. Anesthesia Post-op Instructions and Care and Recovery After Surgery      Colonoscopy, Adult, Care After The following information offers guidance on how to care for yourself after your procedure. Your health care provider may also give you more specific instructions. If you have problems or questions, contact your health care provider. What can I expect after the procedure? After the procedure, it is common to have: A small amount of blood in your stool for 24 hours after the procedure. Some gas. Mild cramping or bloating of your abdomen. Follow these instructions at home: Eating and drinking  Drink enough fluid to keep your urine pale yellow. Follow  instructions from your health care provider about eating or drinking restrictions. Resume your normal diet as told by your health care provider. Avoid heavy or fried foods that are hard to digest. Activity Rest as told by your health care provider. Avoid sitting for a long time without moving. Get up to take short walks every 1-2 hours. This is important to improve blood flow and breathing. Ask for help if you feel weak or unsteady. Return to your normal activities as told by your health care provider. Ask your health care provider what activities are safe for you. Managing cramping and bloating  Try walking around when you have cramps or feel bloated. If directed, apply heat to your abdomen as told by your health care provider. Use the heat source that your health care provider recommends, such as a moist heat pack or a heating pad. Place a towel between your skin and the heat source. Leave the heat on for 20-30 minutes. Remove the heat if your skin turns bright red. This is especially important if you are unable to feel pain, heat, or cold. You have a greater risk of getting burned. General instructions If you were given a sedative during the procedure, it can affect you for several hours. Do not drive or operate machinery until your health  care provider says that it is safe. For the first 24 hours after the procedure: Do not sign important documents. Do not drink alcohol. Do your regular daily activities at a slower pace than normal. Eat soft foods that are easy to digest. Take over-the-counter and prescription medicines only as told by your health care provider. Keep all follow-up visits. This is important. Contact a health care provider if: You have blood in your stool 2-3 days after the procedure. Get help right away if: You have more than a small spotting of blood in your stool. You have large blood clots in your stool. You have swelling of your abdomen. You have nausea or  vomiting. You have a fever. You have increasing pain in your abdomen that is not relieved with medicine. These symptoms may be an emergency. Get help right away. Call 911. Do not wait to see if the symptoms will go away. Do not drive yourself to the hospital. Summary After the procedure, it is common to have a small amount of blood in your stool. You may also have mild cramping and bloating of your abdomen. If you were given a sedative during the procedure, it can affect you for several hours. Do not drive or operate machinery until your health care provider says that it is safe. Get help right away if you have a lot of blood in your stool, nausea or vomiting, a fever, or increased pain in your abdomen. This information is not intended to replace advice given to you by your health care provider. Make sure you discuss any questions you have with your health care provider. Document Revised: 07/18/2021 Document Reviewed: 07/18/2021 Elsevier Patient Education  [place] After This sheet gives you information about how to care for yourself after your procedure. Your health care provider may also give you more specific instructions. If you have problems or questions, contact your health care provider. What can I expect after the procedure? After the procedure, it is common to have: Tiredness. Forgetfulness about what happened after the procedure. Impaired judgment for important decisions. Nausea or vomiting. Some difficulty with balance. Follow these instructions at home: For the time period you were told by your health care provider:     Rest as needed. Do not participate in activities where you could fall or become injured. Do not drive or use machinery. Do not drink alcohol. Do not take sleeping pills or medicines that cause drowsiness. Do not make important decisions or sign legal documents. Do not take care of children on your own. Eating and  drinking Follow the diet that is recommended by your health care provider. Drink enough fluid to keep your urine pale yellow. If you vomit: Drink water, juice, or soup when you can drink without vomiting. Make sure you have little or no nausea before eating solid foods. General instructions Have a responsible adult stay with you for the time you are told. It is important to have someone help care for you until you are awake and alert. Take over-the-counter and prescription medicines only as told by your health care provider. If you have sleep apnea, surgery and certain medicines can increase your risk for breathing problems. Follow instructions from your health care provider about wearing your sleep device: Anytime you are sleeping, including during daytime naps. While taking prescription pain medicines, sleeping medicines, or medicines that make you drowsy. Avoid smoking. Keep all follow-up visits as told by your health care provider. This is important.  Contact a health care provider if: You keep feeling nauseous or you keep vomiting. You feel light-headed. You are still sleepy or having trouble with balance after 24 hours. You develop a rash. You have a fever. You have redness or swelling around the IV site. Get help right away if: You have trouble breathing. You have new-onset confusion at home. Summary For several hours after your procedure, you may feel tired. You may also be forgetful and have poor judgment. Have a responsible adult stay with you for the time you are told. It is important to have someone help care for you until you are awake and alert. Rest as told. Do not drive or operate machinery. Do not drink alcohol or take sleeping pills. Get help right away if you have trouble breathing, or if you suddenly become confused. This information is not intended to replace advice given to you by your health care provider. Make sure you discuss any questions you have with your  health care provider. Document Revised: 10/30/2021 Document Reviewed: 10/28/2019 Elsevier Patient Education  [place].

## 2022-04-26 ENCOUNTER — Other Ambulatory Visit (INDEPENDENT_AMBULATORY_CARE_PROVIDER_SITE_OTHER): Payer: Self-pay

## 2022-04-26 DIAGNOSIS — K743 Primary biliary cirrhosis: Secondary | ICD-10-CM

## 2022-04-26 DIAGNOSIS — Z1211 Encounter for screening for malignant neoplasm of colon: Secondary | ICD-10-CM

## 2022-04-29 ENCOUNTER — Encounter (HOSPITAL_COMMUNITY)
Admission: RE | Admit: 2022-04-29 | Discharge: 2022-04-29 | Disposition: A | Discharge status: Home / Self Care | Payer: Medicare HMO | Source: Ambulatory Visit | Attending: Internal Medicine | Admitting: Internal Medicine

## 2022-04-29 ENCOUNTER — Encounter (HOSPITAL_COMMUNITY): Payer: Self-pay

## 2022-04-29 VITALS — BP 142/71 | HR 63 | Temp 97.8°F | Resp 18 | Ht 66.0 in | Wt 182.0 lb

## 2022-04-29 DIAGNOSIS — Z01818 Encounter for other preprocedural examination: Secondary | ICD-10-CM | POA: Diagnosis not present

## 2022-04-29 DIAGNOSIS — Z1211 Encounter for screening for malignant neoplasm of colon: Secondary | ICD-10-CM | POA: Insufficient documentation

## 2022-04-29 DIAGNOSIS — I10 Essential (primary) hypertension: Secondary | ICD-10-CM | POA: Diagnosis not present

## 2022-04-29 DIAGNOSIS — E669 Obesity, unspecified: Secondary | ICD-10-CM

## 2022-04-29 LAB — BASIC METABOLIC PANEL
Anion gap: 4 — ABNORMAL LOW (ref 5–15)
BUN: 14 mg/dL (ref 8–23)
CO2: 27 mmol/L (ref 22–32)
Calcium: 9.2 mg/dL (ref 8.9–10.3)
Chloride: 104 mmol/L (ref 98–111)
Creatinine, Ser: 0.86 mg/dL (ref 0.44–1.00)
GFR, Estimated: >60 mL/min (ref >60)
Glucose, Bld: 86 mg/dL (ref 70–99)
Potassium: 3.7 mmol/L (ref 3.5–5.1)
Sodium: 135 mmol/L (ref 135–145)

## 2022-05-01 ENCOUNTER — Encounter (HOSPITAL_COMMUNITY)
Admission: RE | Disposition: A | Discharge status: Home / Self Care | Payer: Self-pay | Source: Home / Self Care | Attending: Internal Medicine

## 2022-05-01 ENCOUNTER — Ambulatory Visit (HOSPITAL_COMMUNITY)
Admission: RE | Admit: 2022-05-01 | Discharge: 2022-05-01 | Disposition: A | Discharge status: Home / Self Care | Payer: Medicare HMO | Attending: Internal Medicine | Admitting: Internal Medicine

## 2022-05-01 ENCOUNTER — Ambulatory Visit (HOSPITAL_COMMUNITY): Payer: Medicare HMO | Admitting: Anesthesiology

## 2022-05-01 ENCOUNTER — Encounter (HOSPITAL_COMMUNITY): Payer: Self-pay | Admitting: Internal Medicine

## 2022-05-01 ENCOUNTER — Other Ambulatory Visit: Payer: Self-pay

## 2022-05-01 ENCOUNTER — Ambulatory Visit (HOSPITAL_BASED_OUTPATIENT_CLINIC_OR_DEPARTMENT_OTHER): Payer: Medicare HMO | Admitting: Anesthesiology

## 2022-05-01 DIAGNOSIS — K743 Primary biliary cirrhosis: Secondary | ICD-10-CM | POA: Insufficient documentation

## 2022-05-01 DIAGNOSIS — K5730 Diverticulosis of large intestine without perforation or abscess without bleeding: Secondary | ICD-10-CM

## 2022-05-01 DIAGNOSIS — K449 Diaphragmatic hernia without obstruction or gangrene: Secondary | ICD-10-CM

## 2022-05-01 DIAGNOSIS — K7400 Hepatic fibrosis, unspecified: Secondary | ICD-10-CM

## 2022-05-01 DIAGNOSIS — Z1211 Encounter for screening for malignant neoplasm of colon: Secondary | ICD-10-CM

## 2022-05-01 DIAGNOSIS — R933 Abnormal findings on diagnostic imaging of other parts of digestive tract: Secondary | ICD-10-CM | POA: Diagnosis not present

## 2022-05-01 DIAGNOSIS — D890 Polyclonal hypergammaglobulinemia: Secondary | ICD-10-CM

## 2022-05-01 DIAGNOSIS — K7460 Unspecified cirrhosis of liver: Secondary | ICD-10-CM | POA: Diagnosis not present

## 2022-05-01 DIAGNOSIS — G4700 Insomnia, unspecified: Secondary | ICD-10-CM

## 2022-05-01 DIAGNOSIS — E669 Obesity, unspecified: Secondary | ICD-10-CM

## 2022-05-01 HISTORY — PX: COLONOSCOPY WITH PROPOFOL: SHX5780

## 2022-05-01 HISTORY — PX: ESOPHAGOGASTRODUODENOSCOPY (EGD) WITH PROPOFOL: SHX5813

## 2022-05-01 LAB — HM COLONOSCOPY

## 2022-05-01 SURGERY — COLONOSCOPY WITH PROPOFOL
Anesthesia: General

## 2022-05-01 MED ORDER — METAMUCIL SMOOTH TEXTURE 58.6 % PO POWD: 1.0000 | Freq: Every day | ORAL | Status: DC

## 2022-05-01 MED ORDER — LACTATED RINGERS IV SOLN
INTRAVENOUS | Status: DC
  Administered 2022-05-01: 1000 mL via INTRAVENOUS

## 2022-05-01 MED ORDER — PROPOFOL 500 MG/50ML IV EMUL
INTRAVENOUS | Status: DC | PRN
Start: 2022-05-01 — End: 2022-05-01
  Administered 2022-05-01: 200 ug/kg/min via INTRAVENOUS

## 2022-05-01 MED ORDER — PHENYLEPHRINE 80 MCG/ML (10ML) SYRINGE FOR IV PUSH (FOR BLOOD PRESSURE SUPPORT)
PREFILLED_SYRINGE | INTRAVENOUS | Status: DC | PRN
  Administered 2022-05-01: 160 ug via INTRAVENOUS

## 2022-05-01 MED ORDER — PROPOFOL 10 MG/ML IV BOLUS
INTRAVENOUS | Status: DC | PRN
  Administered 2022-05-01: 90 mg via INTRAVENOUS

## 2022-05-01 MED ORDER — LIDOCAINE 2% (20 MG/ML) 5 ML SYRINGE
INTRAMUSCULAR | Status: DC | PRN
  Administered 2022-05-01: 50 mg via INTRAVENOUS

## 2022-05-01 NOTE — Discharge Instructions (Addendum)
Resume usual medications including aspirin as before. High-fiber diet. Metamucil 1 heaping tablespoonful by mouth daily at bedtime. No driving for 24 hours. No more screening colonoscopies. Office visit in October 2023.

## 2022-05-01 NOTE — Transfer of Care (Signed)
Immediate Anesthesia Transfer of Care Note  Patient: Kathryn Ramos  Procedure(s) Performed: COLONOSCOPY WITH PROPOFOL ESOPHAGOGASTRODUODENOSCOPY (EGD) WITH PROPOFOL  Patient Location: Short Stay  Anesthesia Type:MAC  Level of Consciousness: sedated and patient cooperative  Airway & Oxygen Therapy: Patient Spontanous Breathing and Patient connected to nasal cannula oxygen  Post-op Assessment: Report given to RN, Post -op Vital signs reviewed and stable and Patient moving all extremities  Post vital signs: Reviewed and stable  Last Vitals:  Vitals Value Taken Time  BP    Temp    Pulse    Resp    SpO2      Last Pain:  Vitals:   05/01/22 1038  TempSrc:   PainSc: 0-No pain         Complications: No notable events documented.

## 2022-05-01 NOTE — Anesthesia Preprocedure Evaluation (Signed)
Anesthesia Evaluation  Patient identified by MRN, date of birth, ID band Patient awake    Reviewed: Allergy & Precautions, NPO status , Patient's Chart, lab work & pertinent test results  Airway Mallampati: II  TM Distance: >3 FB Neck ROM: Full    Dental  (+) Dental Advisory Given, Chipped,    Pulmonary neg pulmonary ROS,    Pulmonary exam normal breath sounds clear to auscultation       Cardiovascular Normal cardiovascular exam Rhythm:Regular Rate:Normal  29-Apr-2022 10:02:28 [facility] ROUTINE RECORD January 11, 1956 (40 yr) Female Caucasian Vent. rate 75 BPM PR interval 134 ms QRS duration 70 ms QT/QTcB 376/419 ms P-R-T axes 48 70 57 Normal sinus rhythm with sinus arrhythmia Normal ECG Confirmed by Gwyndolyn Kaufman [phone]) on 04/29/2022 8:57:50 PM   Neuro/Psych  Headaches, negative psych ROS   GI/Hepatic negative GI ROS, (+) Cirrhosis  (Mild)      , Hepatitis -  Endo/Other  negative endocrine ROS  Renal/GU negative Renal ROS  negative genitourinary   Musculoskeletal  (+) Arthritis , Osteoarthritis,    Abdominal   Peds negative pediatric ROS (+)  Hematology negative hematology ROS (+)   Anesthesia Other Findings   Reproductive/Obstetrics negative OB ROS                            Anesthesia Physical Anesthesia Plan  ASA: 2  Anesthesia Plan: General   Post-op Pain Management: Minimal or no pain anticipated   Induction: Intravenous  PONV Risk Score and Plan: Propofol infusion  Airway Management Planned: Nasal Cannula and Natural Airway  Additional Equipment:   Intra-op Plan:   Post-operative Plan:   Informed Consent: I have reviewed the patients History and Physical, chart, labs and discussed the procedure including the risks, benefits and alternatives for the proposed anesthesia with the patient or authorized representative who has indicated his/her  understanding and acceptance.     Dental advisory given  Plan Discussed with: CRNA and Surgeon  Anesthesia Plan Comments:         Anesthesia Quick Evaluation

## 2022-05-01 NOTE — Op Note (Signed)
[facility] Patient Name: Kathryn Ramos Procedure Date: 05/01/2022 10:10 AM MRN: [id number] Date of Birth: [birth year] Attending MD: Hildred Laser , MD CSN: [id number] Age: 66 Admit Type: Outpatient Procedure:                Upper GI endoscopy Indications:              Primary biliary cholangitis ultrasound concerning                            for hepatic fibrosis/cirrhosis Providers:                Hildred Laser, MD, Rosina Lowenstein, RN, Bonnetta Barry,                            Technician Referring MD:             Glenda Chroman, MD Medicines:                Propofol per Anesthesia Complications:            No immediate complications. Estimated Blood Loss:     Estimated blood loss: none. Procedure:                Pre-Anesthesia Assessment:                           - Prior to the procedure, a History and Physical                            was performed, and patient medications and                            allergies were reviewed. The patient's tolerance of                            previous anesthesia was also reviewed. The risks                            and benefits of the procedure and the sedation                            options and risks were discussed with the patient.                            All questions were answered, and informed consent                            was obtained. Prior Anticoagulants: The patient has                            taken no previous anticoagulant or antiplatelet                            agents except for aspirin. ASA Grade Assessment: II                            -  A patient with mild systemic disease. After                            reviewing the risks and benefits, the patient was                            deemed in satisfactory condition to undergo the                            procedure.                           After obtaining informed consent, the endoscope was                            passed under direct vision.  Throughout the                            procedure, the patient's blood pressure, pulse, and                            oxygen saturations were monitored continuously. The                            [id number]-[id number] ([id number]) scope was introduced through the                            mouth, and advanced to the second part of duodenum.                            The upper GI endoscopy was accomplished without                            difficulty. The patient tolerated the procedure                            well. Scope In: 10:42:50 AM Scope Out: 10:45:45 AM Total Procedure Duration: 0 hours 2 minutes 55 seconds  Findings:      The hypopharynx was normal.      The examined esophagus was normal.      The Z-line was regular and was found 35 cm from the incisors.      A 2 cm hiatal hernia was present.      The entire examined stomach was normal.      The duodenal bulb and second portion of the duodenum were normal. Impression:               - Normal hypopharynx.                           - Normal esophagus.                           - Z-line regular, 35 cm from the incisors.                           -  2 cm hiatal hernia.                           - Normal stomach.                           - Normal duodenal bulb and second portion of the                            duodenum.                           - No specimens collected. Moderate Sedation:      Per Anesthesia Care Recommendation:           - Patient has a contact number available for                            emergencies. The signs and symptoms of potential                            delayed complications were discussed with the                            patient. Return to normal activities tomorrow.                            Written discharge instructions were provided to the                            patient.                           - Resume previous diet today.                           - Continue present medications.                            - Repeat upper endoscopy in 2 years. Procedure Code(s):        --- Professional ---                           [phone], Esophagogastroduodenoscopy, flexible,                            transoral; diagnostic, including collection of                            specimen(s) by brushing or washing, when performed                            (separate procedure) Diagnosis Code(s):        --- Professional ---                           K44.9, Diaphragmatic hernia without obstruction or  gangrene                           R93.3, Abnormal findings on diagnostic imaging of                            other parts of digestive tract CPT copyright 2019 American [employer]. All rights reserved. The codes documented in this report are preliminary and upon coder review may  be revised to meet current compliance requirements. Hildred Laser, MD Hildred Laser, MD 05/01/2022 11:20:08 AM This report has been signed electronically. Number of Addenda: 0

## 2022-05-01 NOTE — H&P (Signed)
Kathryn Ramos is an 66 y.o. female.   Chief Complaint: Patient is here for esophagogastroduodenoscopy and colonoscopy. HPI: Patient is 66 year old Caucasian female who has primary biliary cholangitis.  Hepatic function remains well-preserved.  Ultrasound suggested nodularity liver concerning for cirrhosis.  She is therefore undergoing EGD looking for esophageal varices.  She is also undergoing colonoscopy for screening purposes.  She has a history of constipation that she has been able to control watching her diet and taking Colace on as-needed basis.  She denies melena or rectal bleeding.  She does complain of fullness in mid abdomen.  Last colonoscopy was about 10 years ago. Family history is negative for colon cancer.  Past Medical History:  Diagnosis Date   Degeneration, intervertebral disc    Migraine 2009   Osteoarthritis    Primary biliary cirrhosis ([facility]) 2003    Past Surgical History:  Procedure Laterality Date   COLONOSCOPY  03/18/2012   Procedure: COLONOSCOPY;  Surgeon: Rogene Houston, MD;  [facility];  Service: Endoscopy;  Laterality: N/A;  930   colonscopy  2002   DILATION AND CURETTAGE OF UTERUS  1978   ENDOMETRIAL FULGURATION  2005   of a cyst   LIVER BIOPSY  2003   TUBAL LIGATION  1995    Family History  Problem Relation Age of Onset   Dementia Mother    Cancer Father    Social History:  reports that she has never smoked. She has never used smokeless tobacco. She reports that she does not drink alcohol and does not use drugs.  Allergies:  Allergies  Allergen Reactions   Indocin [Indomethacin] Swelling and Rash   Celebrex [Celecoxib]     Loopy messed with stomach - diarrhea   Mobic [Meloxicam] Other (See Comments)    Loopy/ messed with stomach-diarrhea    Penicillins Rash    Medications Prior to Admission  Medication Sig Dispense Refill   acetaminophen (TYLENOL) 500 MG tablet Take 500 mg by mouth every 6 (six) hours as needed (pain.).      amLODipine (NORVASC) 5 MG tablet Take 5 mg by mouth in the morning.     aspirin 81 MG EC tablet Take 81 mg by mouth in the morning.     Cholecalciferol (VITAMIN D3) 50 MCG (2000 UT) TABS Take 2,000 Units by mouth in the morning.     clobetasol ointment (TEMOVATE) 0.16 % Apply 1 application. topically at bedtime.     diphenhydrAMINE (BENADRYL) 25 mg capsule Take 25 mg by mouth at bedtime as needed for sleep.     diphenhydrAMINE HCl, Sleep, (ZZZQUIL) 25 MG CAPS Take 25 mg by mouth at bedtime as needed (sleep).     famotidine (PEPCID) 20 MG tablet Take 20 mg by mouth daily as needed (take with ibuprofen when unable to take with food).     fish oil-omega-3 fatty acids 1000 MG capsule Take 1,000 mg by mouth in the morning.     hydrochlorothiazide (HYDRODIURIL) 12.5 MG tablet Take 12.5 mg by mouth in the morning.     ibuprofen (ADVIL) 800 MG tablet Take 800 mg by mouth every 8 (eight) hours as needed (pain.).     Melatonin 12 MG TABS Place 12 mg under the tongue at bedtime as needed (sleep).     Multiple Vitamin (MULTIVITAMIN WITH MINERALS) TABS tablet Take 1 tablet by mouth in the morning.     polyethylene glycol-electrolytes (NULYTELY) 420 g solution Take 4,000 mLs by mouth as directed.  Turmeric 500 MG CAPS Take 500 mg by mouth in the morning.     ursodiol (ACTIGALL) 300 MG capsule TAKE 2 CAPSULES (600 MG TOTAL) BY MOUTH 2 (TWO) TIMES DAILY. 360 capsule 3    No results found for this or any previous visit (from the past 48 hour(s)). No results found.  Review of Systems  Blood pressure 127/67, pulse 67, temperature 98.2 F (36.8 C), temperature source Oral, resp. rate 16, height '5\' 6"'$  (1.676 m), weight 82.6 kg, SpO2 98 %. Physical Exam HENT:     Mouth/Throat:     Mouth: Mucous membranes are moist.     Pharynx: Oropharynx is clear.  Eyes:     General: No scleral icterus.    Conjunctiva/sclera: Conjunctivae normal.  Cardiovascular:     Rate and Rhythm: Normal rate and regular rhythm.      Heart sounds: Normal heart sounds. No murmur heard. Pulmonary:     Effort: Pulmonary effort is normal.     Breath sounds: Normal breath sounds.  Abdominal:     General: There is no distension.     Palpations: Abdomen is soft. There is no mass.  Musculoskeletal:        General: No swelling.     Cervical back: Neck supple.  Lymphadenopathy:     Cervical: No cervical adenopathy.  Skin:    General: Skin is warm and dry.  Neurological:     Mental Status: She is alert.     Assessment/Plan  Primary biliary cholangitis. Abnormal ultrasound suggesting hepatic fibrosis/cirrhosis. Diagnostic esophagogastroduodenoscopy and average risk screening colonoscopy.  Hildred Laser, MD 05/01/2022, 10:35 AM

## 2022-05-01 NOTE — Op Note (Signed)
[facility] Patient Name: Kathryn Ramos Procedure Date: 05/01/2022 10:11 AM MRN: [id number] Date of Birth: 05-Jul-1956 Attending MD: Hildred Laser , MD CSN: [id number] Age: 66 Admit Type: Outpatient Procedure:                Colonoscopy Indications:              Screening for colorectal malignant neoplasm Providers:                Hildred Laser, MD, Rosina Lowenstein, RN, Bonnetta Barry,                            Technician Referring MD:             Glenda Chroman, MD Medicines:                Propofol per Anesthesia Complications:            No immediate complications. Estimated Blood Loss:     Estimated blood loss: none. Procedure:                Pre-Anesthesia Assessment:                           - Prior to the procedure, a History and Physical                            was performed, and patient medications and                            allergies were reviewed. The patient's tolerance of                            previous anesthesia was also reviewed. The risks                            and benefits of the procedure and the sedation                            options and risks were discussed with the patient.                            All questions were answered, and informed consent                            was obtained. Prior Anticoagulants: The patient has                            taken no previous anticoagulant or antiplatelet                            agents except for aspirin. ASA Grade Assessment: II                            - A patient with mild systemic disease. After  reviewing the risks and benefits, the patient was                            deemed in satisfactory condition to undergo the                            procedure.                           - Prior to the procedure, a History and Physical                            was performed, and patient medications and                            allergies were reviewed. The patient's  tolerance of                            previous anesthesia was also reviewed. The risks                            and benefits of the procedure and the sedation                            options and risks were discussed with the patient.                            All questions were answered, and informed consent                            was obtained. After reviewing the risks and                            benefits, the patient was deemed in satisfactory                            condition to undergo the procedure.                           After obtaining informed consent, the colonoscope                            was passed under direct vision. Throughout the                            procedure, the patient's blood pressure, pulse, and                            oxygen saturations were monitored continuously. The                            [id number]-HQ190L ([id number]) scope was introduced through  the anus and advanced to the the cecum, identified                            by appendiceal orifice and ileocecal valve. The                            colonoscopy was performed without difficulty. The                            patient tolerated the procedure well. The quality                            of the bowel preparation was good. The ileocecal                            valve, appendiceal orifice, and rectum were                            photographed. Scope In: 10:50:00 AM Scope Out: 11:08:00 AM Scope Withdrawal Time: 0 hours 5 minutes 20 seconds  Total Procedure Duration: 0 hours 18 minutes 0 seconds  Findings:      The perianal and digital rectal examinations were normal.      Scattered diverticula were found in the sigmoid colon.      The exam was otherwise normal throughout the examined colon.      The retroflexed view of the distal rectum and anal verge was normal and       showed no anal or rectal abnormalities. Impression:               -  Diverticulosis in the sigmoid colon.                           - No specimens collected. Moderate Sedation:      Per Anesthesia Care Recommendation:           - Patient has a contact number available for                            emergencies. The signs and symptoms of potential                            delayed complications were discussed with the                            patient. Return to normal activities tomorrow.                            Written discharge instructions were provided to the                            patient.                           - High fiber diet today.                           -  Continue present medications.                           - No repeat colonoscopy due to current age (31                            years or older) and the absence of advanced                            adenomas. Procedure Code(s):        --- Professional ---                           [phone], Colonoscopy, flexible; diagnostic, including                            collection of specimen(s) by brushing or washing,                            when performed (separate procedure) Diagnosis Code(s):        --- Professional ---                           Z12.11, Encounter for screening for malignant                            neoplasm of colon                           K57.30, Diverticulosis of large intestine without                            perforation or abscess without bleeding CPT copyright 2019 American Medical Association. All rights reserved. The codes documented in this report are preliminary and upon coder review may  be revised to meet current compliance requirements. Hildred Laser, MD Hildred Laser, MD 05/01/2022 11:23:11 AM This report has been signed electronically. Number of Addenda: 0

## 2022-05-01 NOTE — Anesthesia Postprocedure Evaluation (Signed)
Anesthesia Post Note  Patient: Kathryn Ramos  Procedure(s) Performed: COLONOSCOPY WITH PROPOFOL ESOPHAGOGASTRODUODENOSCOPY (EGD) WITH PROPOFOL  Patient location during evaluation: Phase II Anesthesia Type: General Level of consciousness: awake and alert and oriented Pain management: pain level controlled Vital Signs Assessment: post-procedure vital signs reviewed and stable Respiratory status: spontaneous breathing, nonlabored ventilation and respiratory function stable Cardiovascular status: blood pressure returned to baseline and stable Postop Assessment: no apparent nausea or vomiting Anesthetic complications: no   No notable events documented.   Last Vitals:  Vitals:   05/01/22 0907 05/01/22 1111  BP: 127/67 (!) 100/50  Pulse: 67 75  Resp: 16 20  Temp: 36.8 C 36.5 C  SpO2: 98% 100%    Last Pain:  Vitals:   05/01/22 1111  TempSrc: Oral  PainSc: 0-No pain                 Ludger Bones C Ashaz Robling

## 2022-05-02 ENCOUNTER — Encounter (INDEPENDENT_AMBULATORY_CARE_PROVIDER_SITE_OTHER): Payer: Self-pay | Admitting: *Deleted

## 2022-05-08 ENCOUNTER — Encounter (HOSPITAL_COMMUNITY): Payer: Self-pay | Admitting: Internal Medicine

## 2022-05-23 ENCOUNTER — Other Ambulatory Visit (INDEPENDENT_AMBULATORY_CARE_PROVIDER_SITE_OTHER): Payer: Self-pay

## 2022-05-23 DIAGNOSIS — R1033 Periumbilical pain: Secondary | ICD-10-CM

## 2022-05-29 ENCOUNTER — Ambulatory Visit (HOSPITAL_BASED_OUTPATIENT_CLINIC_OR_DEPARTMENT_OTHER): Payer: Medicare HMO

## 2022-06-01 ENCOUNTER — Ambulatory Visit (HOSPITAL_BASED_OUTPATIENT_CLINIC_OR_DEPARTMENT_OTHER)
Admission: RE | Admit: 2022-06-01 | Discharge: 2022-06-01 | Disposition: A | Discharge status: Home / Self Care | Payer: Medicare HMO | Source: Ambulatory Visit | Attending: Internal Medicine | Admitting: Internal Medicine

## 2022-06-01 DIAGNOSIS — R1033 Periumbilical pain: Secondary | ICD-10-CM

## 2022-06-01 MED ORDER — IOHEXOL 300 MG/ML  SOLN
100.0000 mL | Freq: Once | INTRAMUSCULAR | Status: AC | PRN
  Administered 2022-06-01: 100 mL via INTRAVENOUS

## 2022-06-03 ENCOUNTER — Other Ambulatory Visit (INDEPENDENT_AMBULATORY_CARE_PROVIDER_SITE_OTHER): Payer: Self-pay

## 2022-06-03 DIAGNOSIS — R1033 Periumbilical pain: Secondary | ICD-10-CM

## 2022-07-23 DIAGNOSIS — L900 Lichen sclerosus et atrophicus: Secondary | ICD-10-CM | POA: Diagnosis not present

## 2022-07-23 DIAGNOSIS — Z6829 Body mass index (BMI) 29.0-29.9, adult: Secondary | ICD-10-CM | POA: Diagnosis not present

## 2022-09-03 DIAGNOSIS — Z6830 Body mass index (BMI) 30.0-30.9, adult: Secondary | ICD-10-CM | POA: Diagnosis not present

## 2022-09-03 DIAGNOSIS — Z7189 Other specified counseling: Secondary | ICD-10-CM | POA: Diagnosis not present

## 2022-09-03 DIAGNOSIS — Z789 Other specified health status: Secondary | ICD-10-CM | POA: Diagnosis not present

## 2022-09-03 DIAGNOSIS — Z1339 Encounter for screening examination for other mental health and behavioral disorders: Secondary | ICD-10-CM | POA: Diagnosis not present

## 2022-09-03 DIAGNOSIS — I10 Essential (primary) hypertension: Secondary | ICD-10-CM | POA: Diagnosis not present

## 2022-09-03 DIAGNOSIS — Z299 Encounter for prophylactic measures, unspecified: Secondary | ICD-10-CM | POA: Diagnosis not present

## 2022-09-03 DIAGNOSIS — Z79899 Other long term (current) drug therapy: Secondary | ICD-10-CM | POA: Diagnosis not present

## 2022-09-03 DIAGNOSIS — R5383 Other fatigue: Secondary | ICD-10-CM | POA: Diagnosis not present

## 2022-09-03 DIAGNOSIS — Z23 Encounter for immunization: Secondary | ICD-10-CM | POA: Diagnosis not present

## 2022-09-03 DIAGNOSIS — Z1331 Encounter for screening for depression: Secondary | ICD-10-CM | POA: Diagnosis not present

## 2022-09-03 DIAGNOSIS — Z0000 Encounter for general adult medical examination without abnormal findings: Secondary | ICD-10-CM | POA: Diagnosis not present

## 2022-09-03 DIAGNOSIS — M8580 Other specified disorders of bone density and structure, unspecified site: Secondary | ICD-10-CM | POA: Diagnosis not present

## 2022-09-03 DIAGNOSIS — E7800 Pure hypercholesterolemia, unspecified: Secondary | ICD-10-CM | POA: Diagnosis not present

## 2022-09-14 ENCOUNTER — Telehealth (INDEPENDENT_AMBULATORY_CARE_PROVIDER_SITE_OTHER): Payer: Self-pay | Admitting: Gastroenterology

## 2022-09-14 NOTE — Telephone Encounter (Signed)
I received the results from the blood work-up performed on 09/03/2022.  Lipid panel was normal with a total cholesterol 156, to glycerides 85 and LDL of 89, vitamin D was normal at 43, TSH was normal at 2.9, CBC with white blood cell count of 3.6, hemoglobin 13.3 and platelets 172, CMP with normal alkaline phosphatase 114, total bilirubin 0.4, AST 31, ALT 21, albumin 4.0, creatinine 0.82, BUN 9 and normal electrolytes.  Hi Crystal,  Can you please call the patient and tell the patient the CBC, CMP, lipid panel, vitamin D and TSH were normal?  She does not have any scheduled follow-up appointments, she was supposed to have a follow-up appointment in October 2023 but this is not scheduled.  Mitzie, please schedule her for a follow-up in next available.  Thanks,  Maylon Peppers, MD Gastroenterology and Hepatology [facility]

## 2022-09-15 ENCOUNTER — Other Ambulatory Visit (INDEPENDENT_AMBULATORY_CARE_PROVIDER_SITE_OTHER): Payer: Self-pay | Admitting: Internal Medicine

## 2022-09-15 DIAGNOSIS — K743 Primary biliary cirrhosis: Secondary | ICD-10-CM

## 2022-09-16 NOTE — Telephone Encounter (Signed)
I tried calling patient left a message on vm at her home number, Tried cell number and vm not set up, tried work # per female she was with a costumer.

## 2022-09-16 NOTE — Telephone Encounter (Signed)
Mitzie please call the patient at work number to reschedule the 09/19/2022 appt she states she can not make it that day as she will be out of town.

## 2022-09-16 NOTE — Telephone Encounter (Signed)
Will refill medication for 3 months, needs follow up appointment with any provider in order to receive any refills.  Thanks,  Lailee Hoelzel Castaneda, MD Gastroenterology and Hepatology [facility]  

## 2022-09-16 NOTE — Telephone Encounter (Signed)
I called and left a detailed message that she will need to have an appointment for further refills.

## 2022-09-19 ENCOUNTER — Ambulatory Visit (INDEPENDENT_AMBULATORY_CARE_PROVIDER_SITE_OTHER): Payer: Medicare HMO | Admitting: Gastroenterology

## 2022-10-15 DIAGNOSIS — D225 Melanocytic nevi of trunk: Secondary | ICD-10-CM | POA: Diagnosis not present

## 2022-10-15 DIAGNOSIS — D2271 Melanocytic nevi of right lower limb, including hip: Secondary | ICD-10-CM | POA: Diagnosis not present

## 2022-10-15 DIAGNOSIS — Z1283 Encounter for screening for malignant neoplasm of skin: Secondary | ICD-10-CM | POA: Diagnosis not present

## 2022-10-15 DIAGNOSIS — L718 Other rosacea: Secondary | ICD-10-CM | POA: Diagnosis not present

## 2022-10-15 DIAGNOSIS — D485 Neoplasm of uncertain behavior of skin: Secondary | ICD-10-CM | POA: Diagnosis not present

## 2022-10-16 DIAGNOSIS — Z0000 Encounter for general adult medical examination without abnormal findings: Secondary | ICD-10-CM | POA: Diagnosis not present

## 2022-10-16 DIAGNOSIS — I10 Essential (primary) hypertension: Secondary | ICD-10-CM | POA: Diagnosis not present

## 2022-10-16 DIAGNOSIS — Z789 Other specified health status: Secondary | ICD-10-CM | POA: Diagnosis not present

## 2022-10-16 DIAGNOSIS — Z299 Encounter for prophylactic measures, unspecified: Secondary | ICD-10-CM | POA: Diagnosis not present

## 2022-10-16 DIAGNOSIS — Z6830 Body mass index (BMI) 30.0-30.9, adult: Secondary | ICD-10-CM | POA: Diagnosis not present

## 2022-10-16 DIAGNOSIS — I700 Atherosclerosis of aorta: Secondary | ICD-10-CM | POA: Diagnosis not present

## 2022-10-16 DIAGNOSIS — K745 Biliary cirrhosis, unspecified: Secondary | ICD-10-CM | POA: Diagnosis not present

## 2022-10-30 DIAGNOSIS — L98499 Non-pressure chronic ulcer of skin of other sites with unspecified severity: Secondary | ICD-10-CM | POA: Diagnosis not present

## 2022-10-30 DIAGNOSIS — D485 Neoplasm of uncertain behavior of skin: Secondary | ICD-10-CM | POA: Diagnosis not present

## 2022-11-21 DIAGNOSIS — Z1231 Encounter for screening mammogram for malignant neoplasm of breast: Secondary | ICD-10-CM | POA: Diagnosis not present

## 2022-11-27 ENCOUNTER — Encounter (INDEPENDENT_AMBULATORY_CARE_PROVIDER_SITE_OTHER): Payer: Self-pay | Admitting: *Deleted

## 2022-12-11 ENCOUNTER — Telehealth: Payer: Self-pay | Admitting: *Deleted

## 2022-12-11 DIAGNOSIS — K743 Primary biliary cirrhosis: Secondary | ICD-10-CM

## 2022-12-11 NOTE — Telephone Encounter (Signed)
Received VM from pt to schedule [place] from recall letter she received.  Called pt and made aware of [place] appt details.

## 2022-12-18 ENCOUNTER — Ambulatory Visit (HOSPITAL_COMMUNITY)
Admission: RE | Admit: 2022-12-18 | Discharge: 2022-12-18 | Disposition: A | Discharge status: Home / Self Care | Payer: Medicare HMO | Source: Ambulatory Visit | Attending: Gastroenterology | Admitting: Gastroenterology

## 2022-12-18 ENCOUNTER — Other Ambulatory Visit (INDEPENDENT_AMBULATORY_CARE_PROVIDER_SITE_OTHER): Payer: Self-pay | Admitting: Gastroenterology

## 2022-12-18 DIAGNOSIS — K743 Primary biliary cirrhosis: Secondary | ICD-10-CM

## 2022-12-30 ENCOUNTER — Encounter (INDEPENDENT_AMBULATORY_CARE_PROVIDER_SITE_OTHER): Payer: Self-pay | Admitting: Gastroenterology

## 2022-12-30 ENCOUNTER — Ambulatory Visit (INDEPENDENT_AMBULATORY_CARE_PROVIDER_SITE_OTHER): Payer: Medicare HMO | Admitting: Gastroenterology

## 2022-12-30 VITALS — BP 128/75 | HR 78 | Temp 97.8°F | Ht 66.0 in | Wt 188.4 lb

## 2022-12-30 DIAGNOSIS — K7400 Hepatic fibrosis, unspecified: Secondary | ICD-10-CM

## 2022-12-30 DIAGNOSIS — K743 Primary biliary cirrhosis: Secondary | ICD-10-CM | POA: Diagnosis not present

## 2022-12-30 DIAGNOSIS — K5900 Constipation, unspecified: Secondary | ICD-10-CM

## 2022-12-30 NOTE — Patient Instructions (Addendum)
Continue ursodiol 600 mg BID Perform blood workup in mid March Stop Metamucil and start Benefiber fiber supplements daily to increase stool bulk. If not improving can start Miralax 1 capful daily

## 2022-12-30 NOTE — Progress Notes (Unsigned)
Maylon Peppers, M.D. Gastroenterology & Hepatology [facility] [address] [place], [place] [address]  Primary Care Physician: Glenda Chroman, MD [place] [address]  I will communicate my assessment and recommendations to the referring MD via EMR.  Problems: PBC Possible advanced liver fibrosis  History of Present Illness: Kathryn Ramos is a 67 y.o. female with medical history of PBC with possible liver fibrosis, osteoarthritis, vaginal lichen sclerosis, who presents for follow up of PBC.  The patient was last seen on 09/11/2021.  She was continued on ursodiol 600 mg twice a day.  The patient denies having any nausea, vomiting, fever, chills, hematochezia, melena, hematemesis, abdominal distention, abdominal pain, diarrhea, jaundice, pruritus or weight loss.  Patient reports that as she has used a Geologist, engineering to apply clobetasol in the vaginal area, she has seen a possible hemorrhoid in her anus. She has only had some itching when defecating but no bleeding or painful. She has been using Metamucil to keep her stools softer, which it has led to this but she is also significantly bloated since she started Metamucil. She has had less straining with the Metamucil.  I received the results from the blood work-up performed on 09/03/2022.  Lipid panel was normal with a total cholesterol 156, to glycerides 85 and LDL of 89, vitamin D was normal at 43, TSH was normal at 2.9, CBC with white blood cell count of 3.6, hemoglobin 13.3 and platelets 172, CMP with normal alkaline phosphatase 114, total bilirubin 0.4, AST 31, ALT 21, albumin 4.0, creatinine 0.82, BUN 9 and normal electrolytes.      Notably, she has had imaging in the past that show a nodular contour of her liver which has been concerning for cirrhosis.  However her most recent elastography on 08/28/2021 showed a low K PA of 4.9.  Most recent liver ultrasound on 12/18/2022 did not show any  masses but again showed nodular contour.  Last Colonoscopy: 04/2022 Diverticulosis  Past Medical History: Past Medical History:  Diagnosis Date   Degeneration, intervertebral disc    Migraine 2009   Osteoarthritis    Primary biliary cirrhosis ([place]) 2003    Past Surgical History: Past Surgical History:  Procedure Laterality Date   COLONOSCOPY  03/18/2012   Procedure: COLONOSCOPY;  Surgeon: Rogene Houston, MD;  [facility];  Service: Endoscopy;  Laterality: N/A;  930   COLONOSCOPY WITH PROPOFOL N/A 05/01/2022   Procedure: COLONOSCOPY WITH PROPOFOL;  Surgeon: Rogene Houston, MD;  [facility];  Service: Endoscopy;  Laterality: N/A;  1010   colonscopy  2002   DILATION AND CURETTAGE OF UTERUS  1978   ENDOMETRIAL FULGURATION  2005   of a cyst   ESOPHAGOGASTRODUODENOSCOPY (EGD) WITH PROPOFOL N/A 05/01/2022   Procedure: ESOPHAGOGASTRODUODENOSCOPY (EGD) WITH PROPOFOL;  Surgeon: Rogene Houston, MD;  [facility];  Service: Endoscopy;  Laterality: N/A;   LIVER BIOPSY  2003   TUBAL LIGATION  1995    Family History: Family History  Problem Relation Age of Onset   Dementia Mother    Cancer Father     Social History: Social History   Tobacco Use  Smoking Status Never  Smokeless Tobacco Never   Social History   Substance and Sexual Activity  Alcohol Use No   Social History   Substance and Sexual Activity  Drug Use No    Allergies: Allergies  Allergen Reactions   Indocin [Indomethacin] Swelling and Rash  Celebrex [Celecoxib]     Loopy messed with stomach - diarrhea   Mobic [Meloxicam] Other (See Comments)    Loopy/ messed with stomach-diarrhea    Penicillins Rash    Medications: Current Outpatient Medications  Medication Sig Dispense Refill   acetaminophen (TYLENOL) 500 MG tablet Take 500 mg by mouth every 6 (six) hours as needed (pain.).     amLODipine (NORVASC) 5 MG tablet Take 5 mg by mouth in the morning.     aspirin 81  MG EC tablet Take 81 mg by mouth in the morning.     Cholecalciferol (VITAMIN D3) 50 MCG (2000 UT) TABS Take 2,000 Units by mouth in the morning.     clobetasol ointment (TEMOVATE) 8.41 % Apply 1 application. topically at bedtime.     diphenhydrAMINE (BENADRYL) 25 mg capsule Take 25 mg by mouth at bedtime as needed for sleep.     diphenhydrAMINE HCl, Sleep, (ZZZQUIL) 25 MG CAPS Take 25 mg by mouth at bedtime as needed (sleep).     fish oil-omega-3 fatty acids 1000 MG capsule Take 1,000 mg by mouth in the morning.     hydrochlorothiazide (HYDRODIURIL) 12.5 MG tablet Take 12.5 mg by mouth in the morning.     ibuprofen (ADVIL) 800 MG tablet Take 500 mg by mouth every 8 (eight) hours as needed (pain.).     metroNIDAZOLE (METROGEL) 0.75 % gel Apply 1 Application topically 2 (two) times daily. As needed     Multiple Vitamin (MULTIVITAMIN WITH MINERALS) TABS tablet Take 1 tablet by mouth in the morning.     OVER THE COUNTER MEDICATION Vitamin D3 2000 IU daily/     psyllium (REGULOID) 0.52 g capsule Take 0.52 g by mouth daily. 4 capsules daily.     Turmeric 500 MG CAPS Take 500 mg by mouth in the morning.     ursodiol (ACTIGALL) 300 MG capsule TAKE 2 CAPSULES BY MOUTH TWICE A DAY 120 capsule 2   No current facility-administered medications for this visit.    Review of Systems: GENERAL: negative for malaise, night sweats HEENT: No changes in hearing or vision, no nose bleeds or other nasal problems. NECK: Negative for lumps, goiter, pain and significant neck swelling RESPIRATORY: Negative for cough, wheezing CARDIOVASCULAR: Negative for chest pain, leg swelling, palpitations, orthopnea GI: SEE HPI MUSCULOSKELETAL: Negative for joint pain or swelling, back pain, and muscle pain. SKIN: Negative for lesions, rash PSYCH: Negative for sleep disturbance, mood disorder and recent psychosocial stressors. HEMATOLOGY Negative for prolonged bleeding, bruising easily, and swollen nodes. ENDOCRINE: Negative  for cold or heat intolerance, polyuria, polydipsia and goiter. NEURO: negative for tremor, gait imbalance, syncope and seizures. The remainder of the review of systems is noncontributory.   Physical Exam: BP 128/75 (BP Location: Left Arm, Patient Position: Sitting, Cuff Size: Large)   Pulse 78   Temp 97.8 F (36.6 C) (Temporal)   Ht '5\' 6"'$  (1.676 m)   Wt 188 lb 6.4 oz (85.5 kg)   BMI 30.41 kg/m  GENERAL: The patient is AO x3, in no acute distress. HEENT: Head is normocephalic and atraumatic. EOMI are intact. Mouth is well hydrated and without lesions. NECK: Supple. No masses LUNGS: Clear to auscultation. No presence of rhonchi/wheezing/rales. Adequate chest expansion HEART: RRR, normal s1 and s2. ABDOMEN: Soft, nontender, no guarding, no peritoneal signs, and nondistended. BS +. No masses. EXTREMITIES: Without any cyanosis, clubbing, rash, lesions or edema. NEUROLOGIC: AOx3, no focal motor deficit. SKIN: no jaundice, no rashes  Imaging/Labs: as above  I personally reviewed and interpreted the available labs, imaging and endoscopic files.  Impression and Plan: Kathryn Ramos is a 67 y.o. female with medical history of PBC with possible liver fibrosis, osteoarthritis, vaginal lichen sclerosis, who presents for follow up of PBC.  - Continue ursodiol 600 mg BID - Check CBC, MELD labs and fibrosis test in mid March - Stop Metamucil and start Benefiber fiber supplements daily to increase stool bulk. If not improving can start Miralax 1 capful daily  All questions were answered.      Maylon Peppers, MD Gastroenterology and Hepatology [facility]

## 2023-01-01 DIAGNOSIS — K5900 Constipation, unspecified: Secondary | ICD-10-CM | POA: Insufficient documentation

## 2023-01-15 DIAGNOSIS — Z6830 Body mass index (BMI) 30.0-30.9, adult: Secondary | ICD-10-CM | POA: Diagnosis not present

## 2023-01-15 DIAGNOSIS — Z299 Encounter for prophylactic measures, unspecified: Secondary | ICD-10-CM | POA: Diagnosis not present

## 2023-01-15 DIAGNOSIS — I10 Essential (primary) hypertension: Secondary | ICD-10-CM | POA: Diagnosis not present

## 2023-02-19 DIAGNOSIS — D485 Neoplasm of uncertain behavior of skin: Secondary | ICD-10-CM | POA: Diagnosis not present

## 2023-02-19 DIAGNOSIS — Z1283 Encounter for screening for malignant neoplasm of skin: Secondary | ICD-10-CM | POA: Diagnosis not present

## 2023-02-19 DIAGNOSIS — D225 Melanocytic nevi of trunk: Secondary | ICD-10-CM | POA: Diagnosis not present

## 2023-03-04 DIAGNOSIS — K743 Primary biliary cirrhosis: Secondary | ICD-10-CM | POA: Diagnosis not present

## 2023-03-04 DIAGNOSIS — K7400 Hepatic fibrosis, unspecified: Secondary | ICD-10-CM | POA: Diagnosis not present

## 2023-03-11 LAB — COMPREHENSIVE METABOLIC PANEL
AG Ratio: 1.1 (calc) (ref 1.0–2.5)
ALT: 19 U/L (ref 6–29)
AST: 25 U/L (ref 10–35)
Albumin: 4.1 g/dL (ref 3.6–5.1)
Alkaline phosphatase (APISO): 106 U/L (ref 37–153)
BUN: 16 mg/dL (ref 7–25)
CO2: 31 mmol/L (ref 20–32)
Calcium: 10.1 mg/dL (ref 8.6–10.4)
Chloride: 102 mmol/L (ref 98–110)
Creat: 0.84 mg/dL (ref 0.50–1.05)
Globulin: 3.7 g/dL (calc) (ref 1.9–3.7)
Glucose, Bld: 64 mg/dL — ABNORMAL LOW (ref 65–139)
Potassium: 3.5 mmol/L (ref 3.5–5.3)
Sodium: 140 mmol/L (ref 135–146)
Total Bilirubin: 0.4 mg/dL (ref 0.2–1.2)
Total Protein: 7.8 g/dL (ref 6.1–8.1)

## 2023-03-11 LAB — LIVER FIBROSIS, FIBROTEST-ACTITEST
ALT: 16 U/L (ref 6–29)
Alpha-2-Macroglobulin: 216 mg/dL (ref 106–279)
Apolipoprotein A1: 164 mg/dL (ref 101–198)
Bilirubin: 0.3 mg/dL (ref 0.2–1.2)
Fibrosis Score: 0.2
GGT: 44 U/L (ref 3–65)
Haptoglobin: 142 mg/dL (ref 43–212)
Necroinflammat ACT Score: 0.05
Reference ID: 4848235

## 2023-03-11 LAB — CBC WITH DIFFERENTIAL/PLATELET
Absolute Monocytes: 281 cells/uL (ref 200–950)
Basophils Absolute: 19 cells/uL (ref 0–200)
Basophils Relative: 0.5 %
Eosinophils Absolute: 111 cells/uL (ref 15–500)
Eosinophils Relative: 3 %
HCT: 43.5 % (ref 35.0–45.0)
Hemoglobin: 14.1 g/dL (ref 11.7–15.5)
Lymphs Abs: 969 cells/uL (ref 850–3900)
MCH: 30.2 pg (ref 27.0–33.0)
MCHC: 32.4 g/dL (ref 32.0–36.0)
MCV: 93.1 fL (ref 80.0–100.0)
MPV: 10.2 fL (ref 7.5–12.5)
Monocytes Relative: 7.6 %
Neutro Abs: 2320 cells/uL (ref 1500–7800)
Neutrophils Relative %: 62.7 %
Platelets: 208 10*3/uL (ref 140–400)
RBC: 4.67 10*6/uL (ref 3.80–5.10)
RDW: 11.4 % (ref 11.0–15.0)
Total Lymphocyte: 26.2 %
WBC: 3.7 10*3/uL — ABNORMAL LOW (ref 3.8–10.8)

## 2023-03-11 LAB — PROTIME-INR
INR: 0.9
Prothrombin Time: 10 s (ref 9.0–11.5)

## 2023-04-30 DIAGNOSIS — H524 Presbyopia: Secondary | ICD-10-CM | POA: Diagnosis not present

## 2023-05-07 DIAGNOSIS — Z299 Encounter for prophylactic measures, unspecified: Secondary | ICD-10-CM | POA: Diagnosis not present

## 2023-05-07 DIAGNOSIS — I10 Essential (primary) hypertension: Secondary | ICD-10-CM | POA: Diagnosis not present

## 2023-05-07 DIAGNOSIS — Z6832 Body mass index (BMI) 32.0-32.9, adult: Secondary | ICD-10-CM | POA: Diagnosis not present

## 2023-05-13 ENCOUNTER — Encounter (INDEPENDENT_AMBULATORY_CARE_PROVIDER_SITE_OTHER): Payer: Self-pay | Admitting: *Deleted

## 2023-06-02 ENCOUNTER — Telehealth (INDEPENDENT_AMBULATORY_CARE_PROVIDER_SITE_OTHER): Payer: Self-pay | Admitting: Gastroenterology

## 2023-06-02 ENCOUNTER — Other Ambulatory Visit (INDEPENDENT_AMBULATORY_CARE_PROVIDER_SITE_OTHER): Payer: Self-pay | Admitting: Gastroenterology

## 2023-06-02 DIAGNOSIS — K743 Primary biliary cirrhosis: Secondary | ICD-10-CM

## 2023-06-02 NOTE — Telephone Encounter (Signed)
FYI-\ Pt left voicemail that she received letter regarding follow up US in 6 months. US Abdomen completed in Jan 2024. US Abdomen complete ordered and scheduled for 06/16/23 @ 9:30am;arrive at 9:15 am at [facility]. NPO after midnight. Pt contacted and verbalized understanding. Also gave central scheduling number in case pt had to cancel or reschedule.

## 2023-06-02 NOTE — Telephone Encounter (Signed)
Thanks

## 2023-06-16 ENCOUNTER — Ambulatory Visit (HOSPITAL_COMMUNITY)
Admission: RE | Admit: 2023-06-16 | Discharge: 2023-06-16 | Disposition: A | Discharge status: Home / Self Care | Payer: Medicare HMO | Source: Ambulatory Visit | Attending: Gastroenterology | Admitting: Gastroenterology

## 2023-06-16 DIAGNOSIS — K7689 Other specified diseases of liver: Secondary | ICD-10-CM | POA: Diagnosis not present

## 2023-06-16 DIAGNOSIS — K743 Primary biliary cirrhosis: Secondary | ICD-10-CM | POA: Diagnosis not present

## 2023-06-24 DIAGNOSIS — Z299 Encounter for prophylactic measures, unspecified: Secondary | ICD-10-CM | POA: Diagnosis not present

## 2023-06-24 DIAGNOSIS — L03119 Cellulitis of unspecified part of limb: Secondary | ICD-10-CM | POA: Diagnosis not present

## 2023-06-24 DIAGNOSIS — R42 Dizziness and giddiness: Secondary | ICD-10-CM | POA: Diagnosis not present

## 2023-06-24 DIAGNOSIS — I10 Essential (primary) hypertension: Secondary | ICD-10-CM | POA: Diagnosis not present

## 2023-07-01 DIAGNOSIS — R42 Dizziness and giddiness: Secondary | ICD-10-CM | POA: Diagnosis not present

## 2023-07-01 DIAGNOSIS — Z299 Encounter for prophylactic measures, unspecified: Secondary | ICD-10-CM | POA: Diagnosis not present

## 2023-07-01 DIAGNOSIS — L03119 Cellulitis of unspecified part of limb: Secondary | ICD-10-CM | POA: Diagnosis not present

## 2023-07-01 DIAGNOSIS — I10 Essential (primary) hypertension: Secondary | ICD-10-CM | POA: Diagnosis not present

## 2023-07-08 DIAGNOSIS — Z299 Encounter for prophylactic measures, unspecified: Secondary | ICD-10-CM | POA: Diagnosis not present

## 2023-07-08 DIAGNOSIS — L03116 Cellulitis of left lower limb: Secondary | ICD-10-CM | POA: Diagnosis not present

## 2023-07-08 DIAGNOSIS — I10 Essential (primary) hypertension: Secondary | ICD-10-CM | POA: Diagnosis not present

## 2023-07-21 DIAGNOSIS — M5030 Other cervical disc degeneration, unspecified cervical region: Secondary | ICD-10-CM | POA: Diagnosis not present

## 2023-07-21 DIAGNOSIS — D225 Melanocytic nevi of trunk: Secondary | ICD-10-CM | POA: Diagnosis not present

## 2023-07-21 DIAGNOSIS — M7581 Other shoulder lesions, right shoulder: Secondary | ICD-10-CM | POA: Diagnosis not present

## 2023-07-21 DIAGNOSIS — Z1283 Encounter for screening for malignant neoplasm of skin: Secondary | ICD-10-CM | POA: Diagnosis not present

## 2023-07-28 DIAGNOSIS — L900 Lichen sclerosus et atrophicus: Secondary | ICD-10-CM | POA: Diagnosis not present

## 2023-07-28 DIAGNOSIS — N958 Other specified menopausal and perimenopausal disorders: Secondary | ICD-10-CM | POA: Diagnosis not present

## 2023-08-05 DIAGNOSIS — I700 Atherosclerosis of aorta: Secondary | ICD-10-CM | POA: Diagnosis not present

## 2023-08-05 DIAGNOSIS — K745 Biliary cirrhosis, unspecified: Secondary | ICD-10-CM | POA: Diagnosis not present

## 2023-08-05 DIAGNOSIS — I10 Essential (primary) hypertension: Secondary | ICD-10-CM | POA: Diagnosis not present

## 2023-08-05 DIAGNOSIS — L237 Allergic contact dermatitis due to plants, except food: Secondary | ICD-10-CM | POA: Diagnosis not present

## 2023-08-05 DIAGNOSIS — Z299 Encounter for prophylactic measures, unspecified: Secondary | ICD-10-CM | POA: Diagnosis not present

## 2023-08-05 DIAGNOSIS — E7800 Pure hypercholesterolemia, unspecified: Secondary | ICD-10-CM | POA: Diagnosis not present

## 2023-08-06 DIAGNOSIS — M542 Cervicalgia: Secondary | ICD-10-CM | POA: Diagnosis not present

## 2023-08-06 DIAGNOSIS — M25511 Pain in right shoulder: Secondary | ICD-10-CM | POA: Diagnosis not present

## 2023-08-06 DIAGNOSIS — M47892 Other spondylosis, cervical region: Secondary | ICD-10-CM | POA: Diagnosis not present

## 2023-08-18 DIAGNOSIS — M542 Cervicalgia: Secondary | ICD-10-CM | POA: Diagnosis not present

## 2023-08-18 DIAGNOSIS — M25511 Pain in right shoulder: Secondary | ICD-10-CM | POA: Diagnosis not present

## 2023-08-18 DIAGNOSIS — M7581 Other shoulder lesions, right shoulder: Secondary | ICD-10-CM | POA: Diagnosis not present

## 2023-08-18 DIAGNOSIS — M47892 Other spondylosis, cervical region: Secondary | ICD-10-CM | POA: Diagnosis not present

## 2023-08-18 DIAGNOSIS — M5030 Other cervical disc degeneration, unspecified cervical region: Secondary | ICD-10-CM | POA: Diagnosis not present

## 2023-08-25 DIAGNOSIS — M25511 Pain in right shoulder: Secondary | ICD-10-CM | POA: Diagnosis not present

## 2023-08-25 DIAGNOSIS — M542 Cervicalgia: Secondary | ICD-10-CM | POA: Diagnosis not present

## 2023-08-25 DIAGNOSIS — M47892 Other spondylosis, cervical region: Secondary | ICD-10-CM | POA: Diagnosis not present

## 2023-09-09 DIAGNOSIS — E7800 Pure hypercholesterolemia, unspecified: Secondary | ICD-10-CM | POA: Diagnosis not present

## 2023-09-09 DIAGNOSIS — E559 Vitamin D deficiency, unspecified: Secondary | ICD-10-CM | POA: Diagnosis not present

## 2023-09-09 DIAGNOSIS — R5383 Other fatigue: Secondary | ICD-10-CM | POA: Diagnosis not present

## 2023-09-09 DIAGNOSIS — Z79899 Other long term (current) drug therapy: Secondary | ICD-10-CM | POA: Diagnosis not present

## 2023-09-09 DIAGNOSIS — Z0000 Encounter for general adult medical examination without abnormal findings: Secondary | ICD-10-CM | POA: Diagnosis not present

## 2023-10-03 ENCOUNTER — Telehealth (INDEPENDENT_AMBULATORY_CARE_PROVIDER_SITE_OTHER): Payer: Self-pay | Admitting: Gastroenterology

## 2023-10-03 NOTE — Telephone Encounter (Signed)
Received the most recent results from blood workup performed on 09/10/2023 showing normal TSH 2.8, lipid panel with normal cholesterol 122 2 glycerides 80, LDL 96, HDL 61, CMP with BUN 14, creatinine 0.85, AST 27, ALT 20, alkaline phosphatase 118, total bilirubin 0.4, albumin 4.2, vitamin D normal 52, CBC with WBC 4.4, hemoglobin 14.0 and platelets 223.  Patient is at goal with current dose of ursodeoxycholic acid.  Will continue same management for now.

## 2023-10-05 ENCOUNTER — Other Ambulatory Visit: Payer: Self-pay

## 2023-10-05 ENCOUNTER — Emergency Department (HOSPITAL_COMMUNITY): Payer: Medicare HMO

## 2023-10-05 ENCOUNTER — Encounter (HOSPITAL_COMMUNITY): Payer: Self-pay

## 2023-10-05 ENCOUNTER — Emergency Department (HOSPITAL_COMMUNITY)
Admission: EM | Admit: 2023-10-05 | Discharge: 2023-10-06 | Disposition: A | Discharge status: Home / Self Care | Payer: Medicare HMO | Attending: Emergency Medicine | Admitting: Emergency Medicine

## 2023-10-05 DIAGNOSIS — S20211A Contusion of right front wall of thorax, initial encounter: Secondary | ICD-10-CM | POA: Diagnosis not present

## 2023-10-05 DIAGNOSIS — S299XXA Unspecified injury of thorax, initial encounter: Secondary | ICD-10-CM | POA: Diagnosis not present

## 2023-10-05 DIAGNOSIS — S51812A Laceration without foreign body of left forearm, initial encounter: Secondary | ICD-10-CM | POA: Insufficient documentation

## 2023-10-05 DIAGNOSIS — S199XXA Unspecified injury of neck, initial encounter: Secondary | ICD-10-CM | POA: Diagnosis not present

## 2023-10-05 DIAGNOSIS — S0990XA Unspecified injury of head, initial encounter: Secondary | ICD-10-CM | POA: Diagnosis not present

## 2023-10-05 DIAGNOSIS — S59912A Unspecified injury of left forearm, initial encounter: Secondary | ICD-10-CM | POA: Diagnosis not present

## 2023-10-05 DIAGNOSIS — S3993XA Unspecified injury of pelvis, initial encounter: Secondary | ICD-10-CM | POA: Diagnosis not present

## 2023-10-05 DIAGNOSIS — S20212A Contusion of left front wall of thorax, initial encounter: Secondary | ICD-10-CM | POA: Diagnosis not present

## 2023-10-05 DIAGNOSIS — Z79899 Other long term (current) drug therapy: Secondary | ICD-10-CM | POA: Diagnosis not present

## 2023-10-05 DIAGNOSIS — Z7982 Long term (current) use of aspirin: Secondary | ICD-10-CM | POA: Insufficient documentation

## 2023-10-05 DIAGNOSIS — R000 Tachycardia, unspecified: Secondary | ICD-10-CM | POA: Diagnosis not present

## 2023-10-05 DIAGNOSIS — Y92410 Unspecified street and highway as the place of occurrence of the external cause: Secondary | ICD-10-CM | POA: Diagnosis not present

## 2023-10-05 DIAGNOSIS — M160 Bilateral primary osteoarthritis of hip: Secondary | ICD-10-CM | POA: Diagnosis not present

## 2023-10-05 DIAGNOSIS — K7460 Unspecified cirrhosis of liver: Secondary | ICD-10-CM | POA: Diagnosis not present

## 2023-10-05 DIAGNOSIS — R0902 Hypoxemia: Secondary | ICD-10-CM | POA: Diagnosis not present

## 2023-10-05 DIAGNOSIS — M542 Cervicalgia: Secondary | ICD-10-CM | POA: Diagnosis not present

## 2023-10-05 DIAGNOSIS — S2020XA Contusion of thorax, unspecified, initial encounter: Secondary | ICD-10-CM | POA: Diagnosis not present

## 2023-10-05 DIAGNOSIS — S301XXA Contusion of abdominal wall, initial encounter: Secondary | ICD-10-CM | POA: Insufficient documentation

## 2023-10-05 DIAGNOSIS — M549 Dorsalgia, unspecified: Secondary | ICD-10-CM | POA: Diagnosis not present

## 2023-10-05 DIAGNOSIS — Z743 Need for continuous supervision: Secondary | ICD-10-CM | POA: Diagnosis not present

## 2023-10-05 DIAGNOSIS — R0689 Other abnormalities of breathing: Secondary | ICD-10-CM | POA: Diagnosis not present

## 2023-10-05 DIAGNOSIS — I700 Atherosclerosis of aorta: Secondary | ICD-10-CM | POA: Diagnosis not present

## 2023-10-05 LAB — COMPREHENSIVE METABOLIC PANEL
ALT: 23 U/L (ref 0–44)
AST: 39 U/L (ref 15–41)
Albumin: 3.2 g/dL — ABNORMAL LOW (ref 3.5–5.0)
Alkaline Phosphatase: 89 U/L (ref 38–126)
Anion gap: 11 (ref 5–15)
BUN: 10 mg/dL (ref 8–23)
CO2: 24 mmol/L (ref 22–32)
Calcium: 9.1 mg/dL (ref 8.9–10.3)
Chloride: 102 mmol/L (ref 98–111)
Creatinine, Ser: 0.88 mg/dL (ref 0.44–1.00)
GFR, Estimated: >60 mL/min (ref >60)
Glucose, Bld: 113 mg/dL — ABNORMAL HIGH (ref 70–99)
Potassium: 3.3 mmol/L — ABNORMAL LOW (ref 3.5–5.1)
Sodium: 137 mmol/L (ref 135–145)
Total Bilirubin: 0.5 mg/dL (ref 0.3–1.2)
Total Protein: 6.5 g/dL (ref 6.5–8.1)

## 2023-10-05 LAB — CBC
HCT: 39 % (ref 36.0–46.0)
Hemoglobin: 12.3 g/dL (ref 12.0–15.0)
MCH: 30.1 pg (ref 26.0–34.0)
MCHC: 31.5 g/dL (ref 30.0–36.0)
MCV: 95.4 fL (ref 80.0–100.0)
Platelets: 144 10*3/uL — ABNORMAL LOW (ref 150–400)
RBC: 4.09 MIL/uL (ref 3.87–5.11)
RDW: 12.8 % (ref 11.5–15.5)
WBC: 10 10*3/uL (ref 4.0–10.5)
nRBC: 0 % (ref 0.0–0.2)

## 2023-10-05 LAB — I-STAT CHEM 8, ED
BUN: 14 mg/dL (ref 8–23)
Calcium, Ion: 1.06 mmol/L — ABNORMAL LOW (ref 1.15–1.40)
Chloride: 104 mmol/L (ref 98–111)
Creatinine, Ser: 0.8 mg/dL (ref 0.44–1.00)
Glucose, Bld: 111 mg/dL — ABNORMAL HIGH (ref 70–99)
HCT: 38 % (ref 36.0–46.0)
Hemoglobin: 12.9 g/dL (ref 12.0–15.0)
Potassium: 3.3 mmol/L — ABNORMAL LOW (ref 3.5–5.1)
Sodium: 138 mmol/L (ref 135–145)
TCO2: 22 mmol/L (ref 22–32)

## 2023-10-05 LAB — ETHANOL: Alcohol, Ethyl (B): <10 mg/dL (ref <10)

## 2023-10-05 MED ORDER — ONDANSETRON HCL 4 MG/2ML IJ SOLN
4.0000 mg | Freq: Once | INTRAMUSCULAR | Status: AC
  Administered 2023-10-05: 4 mg via INTRAVENOUS
  Filled 2023-10-05: qty 2

## 2023-10-05 MED ORDER — ACETAMINOPHEN 325 MG PO TABS
650.0000 mg | ORAL_TABLET | Freq: Once | ORAL | Status: AC
  Administered 2023-10-05: 650 mg via ORAL
  Filled 2023-10-05: qty 2

## 2023-10-05 MED ORDER — LACTATED RINGERS IV BOLUS
1000.0000 mL | Freq: Once | INTRAVENOUS | Status: AC
  Administered 2023-10-05: 1000 mL via INTRAVENOUS

## 2023-10-05 MED ORDER — OXYCODONE-ACETAMINOPHEN 5-325 MG PO TABS
1.0000 | ORAL_TABLET | Freq: Four times a day (QID) | ORAL | 0 refills | Status: DC | PRN
Start: 2023-10-05 — End: 2023-10-27

## 2023-10-05 MED ORDER — IBUPROFEN 800 MG PO TABS: 800.0000 mg | ORAL_TABLET | Freq: Three times a day (TID) | ORAL | 0 refills | Status: DC

## 2023-10-05 MED ORDER — OXYCODONE-ACETAMINOPHEN 5-325 MG PO TABS
2.0000 | ORAL_TABLET | Freq: Once | ORAL | Status: AC
  Administered 2023-10-06: 2 via ORAL
  Filled 2023-10-05: qty 2

## 2023-10-05 MED ORDER — IOHEXOL 350 MG/ML SOLN
75.0000 mL | Freq: Once | INTRAVENOUS | Status: AC | PRN
  Administered 2023-10-05: 75 mL via INTRAVENOUS

## 2023-10-05 MED ORDER — DOXYCYCLINE HYCLATE 100 MG PO CAPS: 100.0000 mg | ORAL_CAPSULE | Freq: Two times a day (BID) | ORAL | 0 refills | Status: DC

## 2023-10-05 MED ORDER — MORPHINE SULFATE (PF) 4 MG/ML IV SOLN
4.0000 mg | Freq: Once | INTRAVENOUS | Status: AC
  Administered 2023-10-05: 4 mg via INTRAVENOUS
  Filled 2023-10-05: qty 1

## 2023-10-05 MED ORDER — SODIUM CHLORIDE 0.9 % IV BOLUS
1000.0000 mL | Freq: Once | INTRAVENOUS | Status: AC
  Administered 2023-10-05: 1000 mL via INTRAVENOUS
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
## 2023-10-05 NOTE — ED Notes (Signed)
Unsuccessful attempt at drawing labs x2. Spoke with phlebotomy about getting labs for pt.
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
## 2023-10-05 NOTE — Discharge Instructions (Addendum)
As we discussed, you have a contusion of your chest and abdomen  Please stay hydrated.  I have prescribed ibuprofen for pain and Percocet for severe pain.  I have prescribed doxycycline twice a day for a week to prevent infection.  See your doctor for follow-up.  Please keep your left arm wound dressed and you can remove the dressing in 3 to 4 days and you need to see your doctor for wound check.  I also expect some swelling in the left arm.  I recommend you follow-up with orthopedic doctor  Return to ER if you have severe arm pain or chest pain or abdominal pain or passing out.

## 2023-10-05 NOTE — ED Notes (Signed)
Patient transported to CT 

## 2023-10-05 NOTE — ED Notes (Signed)
Pt up to bathroom with assistance. Pt initially tolerated well but on return from bathroom, NT notified this RN that while she was helping pt get back to room, pt began to feel dizzy and had a near syncopal episode. EDP notified of situation.

## 2023-10-06 NOTE — ED Notes (Signed)
The wife is on the stretcher and the husband is in the recliner at  the bedside waiting to go home in the am

## 2023-10-06 NOTE — ED Notes (Addendum)
Pain med given for  lt abd pain

## 2023-10-06 NOTE — ED Notes (Signed)
RN just checked in on pts again, they are both awake and have tried another person but they can not come and pick them up.  Have one more person however can't call until later.

## 2023-10-14 DIAGNOSIS — M438X6 Other specified deforming dorsopathies, lumbar region: Secondary | ICD-10-CM | POA: Diagnosis not present

## 2023-10-14 DIAGNOSIS — M545 Low back pain, unspecified: Secondary | ICD-10-CM | POA: Diagnosis not present

## 2023-10-14 DIAGNOSIS — M47816 Spondylosis without myelopathy or radiculopathy, lumbar region: Secondary | ICD-10-CM | POA: Diagnosis not present

## 2023-10-14 DIAGNOSIS — M48061 Spinal stenosis, lumbar region without neurogenic claudication: Secondary | ICD-10-CM | POA: Diagnosis not present

## 2023-10-14 DIAGNOSIS — M4316 Spondylolisthesis, lumbar region: Secondary | ICD-10-CM | POA: Diagnosis not present

## 2023-10-27 ENCOUNTER — Inpatient Hospital Stay: Payer: Medicare HMO

## 2023-10-27 ENCOUNTER — Inpatient Hospital Stay: Payer: Medicare HMO | Attending: Oncology | Admitting: Oncology

## 2023-10-27 VITALS — BP 132/75 | HR 82 | Temp 98.7°F | Resp 18 | Ht 64.37 in | Wt 193.0 lb

## 2023-10-27 DIAGNOSIS — I10 Essential (primary) hypertension: Secondary | ICD-10-CM | POA: Diagnosis not present

## 2023-10-27 DIAGNOSIS — D890 Polyclonal hypergammaglobulinemia: Secondary | ICD-10-CM

## 2023-10-27 DIAGNOSIS — Z1231 Encounter for screening mammogram for malignant neoplasm of breast: Secondary | ICD-10-CM | POA: Insufficient documentation

## 2023-10-27 DIAGNOSIS — M899 Disorder of bone, unspecified: Secondary | ICD-10-CM | POA: Insufficient documentation

## 2023-10-27 LAB — COMPREHENSIVE METABOLIC PANEL
ALT: 18 U/L (ref 0–44)
AST: 30 U/L (ref 15–41)
Albumin: 3.7 g/dL (ref 3.5–5.0)
Alkaline Phosphatase: 146 U/L — ABNORMAL HIGH (ref 38–126)
Anion gap: 8 (ref 5–15)
BUN: 11 mg/dL (ref 8–23)
CO2: 24 mmol/L (ref 22–32)
Calcium: 9.3 mg/dL (ref 8.9–10.3)
Chloride: 103 mmol/L (ref 98–111)
Creatinine, Ser: 0.81 mg/dL (ref 0.44–1.00)
GFR, Estimated: >60 mL/min (ref >60)
Glucose, Bld: 134 mg/dL — ABNORMAL HIGH (ref 70–99)
Potassium: 3.3 mmol/L — ABNORMAL LOW (ref 3.5–5.1)
Sodium: 135 mmol/L (ref 135–145)
Total Bilirubin: 0.5 mg/dL (ref <1.2)
Total Protein: 7.5 g/dL (ref 6.5–8.1)

## 2023-10-27 LAB — CBC WITH DIFFERENTIAL/PLATELET
Abs Immature Granulocytes: 0.01 10*3/uL (ref 0.00–0.07)
Basophils Absolute: 0 10*3/uL (ref 0.0–0.1)
Basophils Relative: 1 %
Eosinophils Absolute: 0.1 10*3/uL (ref 0.0–0.5)
Eosinophils Relative: 2 %
HCT: 39.7 % (ref 36.0–46.0)
Hemoglobin: 12.7 g/dL (ref 12.0–15.0)
Immature Granulocytes: 0 %
Lymphocytes Relative: 28 %
Lymphs Abs: 1.1 10*3/uL (ref 0.7–4.0)
MCH: 29.9 pg (ref 26.0–34.0)
MCHC: 32 g/dL (ref 30.0–36.0)
MCV: 93.4 fL (ref 80.0–100.0)
Monocytes Absolute: 0.3 10*3/uL (ref 0.1–1.0)
Monocytes Relative: 7 %
Neutro Abs: 2.6 10*3/uL (ref 1.7–7.7)
Neutrophils Relative %: 62 %
Platelets: 204 10*3/uL (ref 150–400)
RBC: 4.25 MIL/uL (ref 3.87–5.11)
RDW: 12.7 % (ref 11.5–15.5)
WBC: 4.1 10*3/uL (ref 4.0–10.5)
nRBC: 0 % (ref 0.0–0.2)

## 2023-10-27 NOTE — Patient Instructions (Addendum)
[facility]  Discharge Instructions  You were seen and examined today by Dr. Anders Simmonds. Dr. Anders Simmonds is a medical oncologist, meaning that he specializes in the treatment of cancer diagnoses. Dr. Anders Simmonds discussed your past medical history, family history of cancers, and the events that led to you being here today.  You were referred to Dr. Anders Simmonds due to an abnormality on your forearm x-ray. There was a transparent spot in the bone on your forearm, these can be seen with cancers. There is nothing concerning so far on your blood work and you previously had a work-up for Multiple Myeloma was negative. Dr. Anders Simmonds has recommended additional labs today for further evaluation.  Follow-up as scheduled.  Thank you for choosing [facility] to provide your oncology and hematology care.   To afford each patient quality time with our provider, please arrive at least 15 minutes before your scheduled appointment time. You may need to reschedule your appointment if you arrive late (10 or more minutes). Arriving late affects you and other patients whose appointments are after yours.  Also, if you miss three or more appointments without notifying the office, you may be dismissed from the clinic at the provider's discretion.    Again, thank you for choosing [facility].  Our hope is that these requests will decrease the amount of time that you wait before being seen by our physicians.   If you have a lab appointment with the Cancer Center - please note that after April 8th, all labs will be drawn in the cancer center.  You do not have to check in or register with the main entrance as you have in the past but will complete your check-in at the cancer center.            _____________________________________________________________  Should you have questions after your visit to [facility], please contact our office at [phone] and  follow the prompts.  Our office hours are 8:00 a.m. to 4:30 p.m. Monday - Thursday and 8:00 a.m. to 2:30 p.m. Friday.  Please note that voicemails left after 4:00 p.m. may not be returned until the following business day.  We are closed weekends and all major holidays.  You do have access to a nurse 24-7, just call the main number to the clinic [phone] and do not press any options, hold on the line and a nurse will answer the phone.    For prescription refill requests, have your pharmacy contact our office and allow 72 hours.    Masks are no longer required in the cancer centers. If you would like for your care team to wear a mask while they are taking care of you, please let them know. You may have one support person who is at least 69 years old accompany you for your appointments.
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
## 2023-10-27 NOTE — Assessment & Plan Note (Signed)
Last mammogram in December was normal, due for next one in December. -Continue with scheduled mammogram in December.
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
## 2023-10-28 LAB — IGG, IGA, IGM
IgA: 140 mg/dL (ref 87–352)
IgG (Immunoglobin G), Serum: 1476 mg/dL (ref 586–1602)
IgM (Immunoglobulin M), Srm: 272 mg/dL — ABNORMAL HIGH (ref 26–217)

## 2023-10-28 LAB — KAPPA/LAMBDA LIGHT CHAINS
Kappa free light chain: 37.8 mg/L — ABNORMAL HIGH (ref 3.3–19.4)
Kappa, lambda light chain ratio: 1.42 (ref 0.26–1.65)
Lambda free light chains: 26.7 mg/L — ABNORMAL HIGH (ref 5.7–26.3)

## 2023-10-28 LAB — BETA 2 MICROGLOBULIN, SERUM: Beta-2 Microglobulin: 3 mg/L — ABNORMAL HIGH (ref 0.6–2.4)

## 2023-10-29 DIAGNOSIS — I10 Essential (primary) hypertension: Secondary | ICD-10-CM | POA: Diagnosis not present

## 2023-10-29 DIAGNOSIS — Z6831 Body mass index (BMI) 31.0-31.9, adult: Secondary | ICD-10-CM | POA: Diagnosis not present

## 2023-10-30 LAB — PROTEIN ELECTROPHORESIS, SERUM
A/G Ratio: 1.1 (ref 0.7–1.7)
Albumin ELP: 3.7 g/dL (ref 2.9–4.4)
Alpha-1-Globulin: 0.3 g/dL (ref 0.0–0.4)
Alpha-2-Globulin: 0.8 g/dL (ref 0.4–1.0)
Beta Globulin: 1 g/dL (ref 0.7–1.3)
Gamma Globulin: 1.4 g/dL (ref 0.4–1.8)
Globulin, Total: 3.4 g/dL (ref 2.2–3.9)
Total Protein ELP: 7.1 g/dL (ref 6.0–8.5)

## 2023-10-31 LAB — IMMUNOFIXATION ELECTROPHORESIS
IgA: 142 mg/dL (ref 87–352)
IgG (Immunoglobin G), Serum: 1596 mg/dL (ref 586–1602)
IgM (Immunoglobulin M), Srm: 277 mg/dL — ABNORMAL HIGH (ref 26–217)
Total Protein ELP: 7.3 g/dL (ref 6.0–8.5)

## 2023-11-03 ENCOUNTER — Telehealth (INDEPENDENT_AMBULATORY_CARE_PROVIDER_SITE_OTHER): Payer: Self-pay
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
## 2023-11-17 ENCOUNTER — Inpatient Hospital Stay: Payer: Medicare HMO | Attending: Physician Assistant | Admitting: Physician Assistant

## 2023-11-17 VITALS — BP 133/72 | HR 81 | Temp 97.8°F | Resp 16 | Wt 195.1 lb

## 2023-11-17 DIAGNOSIS — M899 Disorder of bone, unspecified: Secondary | ICD-10-CM | POA: Diagnosis not present

## 2023-11-17 DIAGNOSIS — D890 Polyclonal hypergammaglobulinemia: Secondary | ICD-10-CM | POA: Diagnosis not present

## 2023-11-17 DIAGNOSIS — S40812A Abrasion of left upper arm, initial encounter: Secondary | ICD-10-CM | POA: Diagnosis not present

## 2023-11-17 NOTE — Patient Instructions (Signed)
[facility] at [facility] **VISIT SUMMARY & IMPORTANT INSTRUCTIONS **   You were seen today by Rojelio Brenner PA-C for your polyclonal gammopathy and abnormal x-ray of your left forearm.    POLYCLONAL GAMMOPATHY You have some elevations in your immunoglobulins (immune system proteins). This is commonly seen in patients with liver disease, especially PBC. You DO NOT have any evidence of monoclonal gammopathy or multiple myeloma.  ABNORMAL LEFT FOREARM X-RAY Since your labs did not show any evidence of multiple myeloma cancer, we suspect bone lesion is likely benign. We will check repeat x-ray in 6 months.  FOLLOW-UP APPOINTMENT: Labs, x-ray, and office visit in 6 months.  ** Thank you for trusting me with your healthcare!  I strive to provide all of my patients with quality care at each visit.  If you receive a survey for this visit, I would be so grateful to you for taking the time to provide feedback.  Thank you in advance!  ~ Rojelio Brenner, PA-C    - - - - - - - - - - - - - - - - - -    Thank you for choosing [facility] at [facility] to provide your oncology and hematology care.  To afford each patient quality time with our provider, please arrive at least 15 minutes before your scheduled appointment time.   If you have a lab appointment with the Cancer Center please come in thru the Main Entrance and check in at the main information desk.  You need to re-schedule your appointment should you arrive 10 or more minutes late.  We strive to give you quality time with our providers, and arriving late affects you and other patients whose appointments are after yours.  Also, if you no show three or more times for appointments you may be dismissed from the clinic at the providers discretion.     Again, thank you for choosing [facility].  Our hope is that these requests will decrease the amount of time that you wait before  being seen by our physicians.       _____________________________________________________________  Should you have questions after your visit to [facility], please contact our office at [phone] and follow the prompts.  Our office hours are 8:00 a.m. and 4:30 p.m. Monday - Friday.  Please note that voicemails left after 4:00 p.m. may not be returned until the following business day.  We are closed weekends and major holidays.  You do have access to a nurse 24-7, just call the main number to the clinic [phone] and do not press any options, hold on the line and a nurse will answer the phone.    For prescription refill requests, have your pharmacy contact our office and allow 72 hours.

## 2023-11-24 DIAGNOSIS — Z1231 Encounter for screening mammogram for malignant neoplasm of breast: Secondary | ICD-10-CM | POA: Diagnosis not present

## 2023-11-25 DIAGNOSIS — R102 Pelvic and perineal pain: Secondary | ICD-10-CM | POA: Diagnosis not present

## 2023-11-25 DIAGNOSIS — S301XXS Contusion of abdominal wall, sequela: Secondary | ICD-10-CM | POA: Diagnosis not present

## 2023-11-26 ENCOUNTER — Other Ambulatory Visit (INDEPENDENT_AMBULATORY_CARE_PROVIDER_SITE_OTHER): Payer: Self-pay | Admitting: Gastroenterology

## 2023-11-26 ENCOUNTER — Encounter (INDEPENDENT_AMBULATORY_CARE_PROVIDER_SITE_OTHER): Payer: Self-pay | Admitting: *Deleted

## 2023-11-26 DIAGNOSIS — K743 Primary biliary cirrhosis: Secondary | ICD-10-CM

## 2023-11-26 NOTE — Telephone Encounter (Signed)
Last seen by Dr.  Levon Hedger 12/30/22. Note states continue urosdiol 600mg  bid. Has upcoming appt 01/05/24.

## 2023-12-04 ENCOUNTER — Telehealth (INDEPENDENT_AMBULATORY_CARE_PROVIDER_SITE_OTHER): Payer: Self-pay | Admitting: Gastroenterology

## 2023-12-04 DIAGNOSIS — K743 Primary biliary cirrhosis: Secondary | ICD-10-CM

## 2023-12-04 DIAGNOSIS — R102 Pelvic and perineal pain: Secondary | ICD-10-CM | POA: Diagnosis not present

## 2023-12-04 DIAGNOSIS — S301XXS Contusion of abdominal wall, sequela: Secondary | ICD-10-CM | POA: Diagnosis not present

## 2023-12-04 DIAGNOSIS — R1030 Lower abdominal pain, unspecified: Secondary | ICD-10-CM | POA: Diagnosis not present

## 2023-12-04 DIAGNOSIS — X58XXXS Exposure to other specified factors, sequela: Secondary | ICD-10-CM | POA: Diagnosis not present

## 2023-12-04 NOTE — Telephone Encounter (Signed)
Pt called in stating the she received letter about scheduling her 6 month ultrasound. Last ultrasound was [place] ABD Complete in July. Do I need to reorder [place] Amb complete? Please advise. Thank you  (Pt unavailable on 1/3;1/7;1/9;1/16;1/17)

## 2023-12-04 NOTE — Telephone Encounter (Signed)
Hi, Needs RUQ [place] only Thanks

## 2023-12-05 DIAGNOSIS — Z79899 Other long term (current) drug therapy: Secondary | ICD-10-CM | POA: Diagnosis not present

## 2023-12-05 DIAGNOSIS — M818 Other osteoporosis without current pathological fracture: Secondary | ICD-10-CM | POA: Diagnosis not present

## 2023-12-05 DIAGNOSIS — E2839 Other primary ovarian failure: Secondary | ICD-10-CM | POA: Diagnosis not present

## 2023-12-05 NOTE — Telephone Encounter (Signed)
Left detailed message on answering machine.

## 2023-12-05 NOTE — Telephone Encounter (Signed)
[place] scheduled for 12/15/23 at 8:30am;arrival time 8:15am. NPO after midnight.  Left message to return call.

## 2023-12-05 NOTE — Addendum Note (Signed)
Addended by: Marlowe Shores on: 12/05/2023 08:22 AM   Modules accepted: Orders

## 2023-12-15 ENCOUNTER — Ambulatory Visit (HOSPITAL_COMMUNITY): Payer: Medicare HMO

## 2023-12-19 ENCOUNTER — Ambulatory Visit (HOSPITAL_COMMUNITY)
Admission: RE | Admit: 2023-12-19 | Discharge: 2023-12-19 | Disposition: A | Discharge status: Home / Self Care | Payer: Medicare HMO | Source: Ambulatory Visit | Attending: Gastroenterology

## 2023-12-19 DIAGNOSIS — K743 Primary biliary cirrhosis: Secondary | ICD-10-CM | POA: Diagnosis not present

## 2023-12-19 DIAGNOSIS — K7689 Other specified diseases of liver: Secondary | ICD-10-CM | POA: Diagnosis not present

## 2024-01-05 ENCOUNTER — Ambulatory Visit (INDEPENDENT_AMBULATORY_CARE_PROVIDER_SITE_OTHER): Payer: Medicare HMO | Admitting: Gastroenterology

## 2024-01-05 ENCOUNTER — Encounter (INDEPENDENT_AMBULATORY_CARE_PROVIDER_SITE_OTHER): Payer: Self-pay | Admitting: Gastroenterology

## 2024-01-05 VITALS — BP 136/86 | HR 69 | Temp 97.8°F | Ht 64.0 in | Wt 192.5 lb

## 2024-01-05 DIAGNOSIS — K743 Primary biliary cirrhosis: Secondary | ICD-10-CM | POA: Diagnosis not present

## 2024-01-05 DIAGNOSIS — K7400 Hepatic fibrosis, unspecified: Secondary | ICD-10-CM
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
## 2024-01-05 NOTE — Patient Instructions (Signed)
Perform blood workup Continue ursodiol 600 mg BID.

## 2024-01-06 LAB — COMPREHENSIVE METABOLIC PANEL
AG Ratio: 1.1 (calc) (ref 1.0–2.5)
ALT: 18 U/L (ref 6–29)
AST: 32 U/L (ref 10–35)
Albumin: 4 g/dL (ref 3.6–5.1)
Alkaline phosphatase (APISO): 114 U/L (ref 37–153)
BUN: 12 mg/dL (ref 7–25)
CO2: 29 mmol/L (ref 20–32)
Calcium: 10.2 mg/dL (ref 8.6–10.4)
Chloride: 102 mmol/L (ref 98–110)
Creat: 0.71 mg/dL (ref 0.50–1.05)
Globulin: 3.5 g/dL (ref 1.9–3.7)
Glucose, Bld: 95 mg/dL (ref 65–139)
Potassium: 4 mmol/L (ref 3.5–5.3)
Sodium: 138 mmol/L (ref 135–146)
Total Bilirubin: 0.4 mg/dL (ref 0.2–1.2)
Total Protein: 7.5 g/dL (ref 6.1–8.1)

## 2024-01-07 ENCOUNTER — Telehealth (INDEPENDENT_AMBULATORY_CARE_PROVIDER_SITE_OTHER): Payer: Self-pay | Admitting: Gastroenterology

## 2024-01-07 NOTE — Telephone Encounter (Signed)
I received the results of the most recent DEXA scan performed on 12/05/2023, which showed changes consistent with osteopenia (T-score -2.4).  This has been followed by her PCP.  She should continue with bone scans every 2 years.

## 2024-03-18 ENCOUNTER — Encounter (INDEPENDENT_AMBULATORY_CARE_PROVIDER_SITE_OTHER): Payer: Self-pay | Admitting: *Deleted

## 2024-03-23 DIAGNOSIS — D225 Melanocytic nevi of trunk: Secondary | ICD-10-CM | POA: Diagnosis not present

## 2024-03-23 DIAGNOSIS — Z1283 Encounter for screening for malignant neoplasm of skin: Secondary | ICD-10-CM | POA: Diagnosis not present

## 2024-03-23 DIAGNOSIS — T300 Burn of unspecified body region, unspecified degree: Secondary | ICD-10-CM | POA: Diagnosis not present

## 2024-03-23 DIAGNOSIS — S80862A Insect bite (nonvenomous), left lower leg, initial encounter: Secondary | ICD-10-CM | POA: Diagnosis not present

## 2024-04-13 ENCOUNTER — Telehealth (INDEPENDENT_AMBULATORY_CARE_PROVIDER_SITE_OTHER): Payer: Self-pay | Admitting: Gastroenterology

## 2024-04-13 DIAGNOSIS — K743 Primary biliary cirrhosis: Secondary | ICD-10-CM

## 2024-04-13 NOTE — Telephone Encounter (Signed)
 Who is your primary care physician: [facility]  Are you diabetic? If yes, Type 1 or Type 2?    no  Do you have a prosthetic or mechanical heart valve? no  Do you have a pacemaker/defibrillator?   no  Have you had endocarditis/atrial fibrillation? no  Have you had joint replacement within the last 12 months?  no  Do you tend to be constipated or have to use laxatives? no  Do you have any history of drugs or alchohol?  no  Do you use supplemental oxygen?  no  Have you had a stroke or heart attack within the last 6 months? no  Do you take weight loss medication?  no  For female patients: have you had a hysterectomy?  no                                     are you post menopausal?       yes                                            do you still have your menstrual cycle? no      Do you take any blood-thinning medications such as: (aspirin, warfarin, Plavix, Aggrenox)  aspirin 81 gm  If yes we need the name, milligram, dosage and who is prescribing doctor aspirin 81 mg q day [facility]  Current Outpatient Medications on File Prior to Visit  Medication Sig Dispense Refill   acetaminophen  (TYLENOL ) 500 MG tablet Take 500 mg by mouth every 6 (six) hours as needed (pain.).     amLODipine (NORVASC) 5 MG tablet Take 5 mg by mouth in the morning.     aspirin 81 MG EC tablet Take 81 mg by mouth in the morning.     augmented betamethasone dipropionate (DIPROLENE-AF) 0.05 % cream Apply topically 2 (two) times daily as needed.     Cholecalciferol (VITAMIN D3) 50 MCG (2000 UT) TABS Take 2,000 Units by mouth in the morning.     clobetasol ointment (TEMOVATE) 0.05 % Apply 1 application. topically at bedtime.     diphenhydrAMINE (BENADRYL) 25 mg capsule Take 25 mg by mouth at bedtime as needed for sleep.     diphenhydrAMINE HCl, Sleep, (ZZZQUIL) 25 MG CAPS Take 25 mg by mouth at bedtime as needed (sleep).     fish oil-omega-3 fatty acids 1000 MG capsule Take 1,000 mg by  mouth in the morning.     hydrochlorothiazide (HYDRODIURIL) 12.5 MG tablet Take 12.5 mg by mouth in the morning.     metroNIDAZOLE (METROGEL) 0.75 % gel Apply 1 Application topically 2 (two) times daily. As needed     Multiple Vitamin (MULTIVITAMIN WITH MINERALS) TABS tablet Take 1 tablet by mouth in the morning.     ursodiol  (ACTIGALL ) 300 MG capsule TAKE 2 CAPSULES BY MOUTH TWICE A DAY 120 capsule 2   No current facility-administered medications on file prior to visit.    Allergies  Allergen Reactions   Indocin [Indomethacin] Swelling and Rash   Celebrex [Celecoxib]     Loopy messed with stomach - diarrhea   Mobic [Meloxicam] Other (See Comments)    Loopy/ messed with stomach-diarrhea    Levofloxacin Rash   Penicillins Rash      Primary Insurance Name: Raina Bunting  Best number where you can be reached: [phone]

## 2024-04-13 NOTE — Telephone Encounter (Signed)
 Room 1 Thanks

## 2024-04-14 NOTE — Telephone Encounter (Signed)
 Will call with June schedule or if spot on 5/30 does not get filled.

## 2024-04-19 NOTE — Addendum Note (Signed)
 Addended by: Edris Friedt on: 04/19/2024 01:34 PM   Modules accepted: Orders

## 2024-04-19 NOTE — Telephone Encounter (Signed)
 Left message to return call

## 2024-04-19 NOTE — Telephone Encounter (Signed)
 Questionnaire from recall, no referral needed

## 2024-04-19 NOTE — Telephone Encounter (Signed)
 Pt left voicemail returning call. Returned call to pt and scheduled pt for 05/07/24. Instructions will be mailed to pt. No Pa needed per insurance.

## 2024-05-05 ENCOUNTER — Encounter (HOSPITAL_COMMUNITY)
Admission: RE | Admit: 2024-05-05 | Discharge: 2024-05-05 | Disposition: A | Discharge status: Home / Self Care | Source: Ambulatory Visit | Attending: Gastroenterology | Admitting: Gastroenterology

## 2024-05-05 ENCOUNTER — Encounter (HOSPITAL_COMMUNITY): Payer: Self-pay

## 2024-05-05 HISTORY — DX: Essential (primary) hypertension: I10

## 2024-05-07 ENCOUNTER — Ambulatory Visit (HOSPITAL_COMMUNITY)
Admission: RE | Admit: 2024-05-07 | Discharge: 2024-05-07 | Disposition: A | Discharge status: Home / Self Care | Source: Ambulatory Visit | Attending: Gastroenterology | Admitting: Gastroenterology

## 2024-05-07 ENCOUNTER — Ambulatory Visit (HOSPITAL_COMMUNITY): Admitting: Anesthesiology

## 2024-05-07 ENCOUNTER — Encounter (HOSPITAL_COMMUNITY): Payer: Self-pay | Admitting: Gastroenterology

## 2024-05-07 ENCOUNTER — Other Ambulatory Visit: Payer: Self-pay

## 2024-05-07 ENCOUNTER — Encounter (HOSPITAL_COMMUNITY)
Admission: RE | Disposition: A | Discharge status: Home / Self Care | Payer: Self-pay | Source: Ambulatory Visit | Attending: Gastroenterology

## 2024-05-07 DIAGNOSIS — R519 Headache, unspecified: Secondary | ICD-10-CM | POA: Diagnosis not present

## 2024-05-07 DIAGNOSIS — Z79899 Other long term (current) drug therapy: Secondary | ICD-10-CM | POA: Insufficient documentation

## 2024-05-07 DIAGNOSIS — K449 Diaphragmatic hernia without obstruction or gangrene: Secondary | ICD-10-CM | POA: Insufficient documentation

## 2024-05-07 DIAGNOSIS — K759 Inflammatory liver disease, unspecified: Secondary | ICD-10-CM

## 2024-05-07 DIAGNOSIS — K2960 Other gastritis without bleeding: Secondary | ICD-10-CM | POA: Diagnosis not present

## 2024-05-07 DIAGNOSIS — I10 Essential (primary) hypertension: Secondary | ICD-10-CM | POA: Diagnosis not present

## 2024-05-07 DIAGNOSIS — K2970 Gastritis, unspecified, without bleeding: Secondary | ICD-10-CM

## 2024-05-07 DIAGNOSIS — K3189 Other diseases of stomach and duodenum: Secondary | ICD-10-CM

## 2024-05-07 DIAGNOSIS — K7400 Hepatic fibrosis, unspecified: Secondary | ICD-10-CM | POA: Diagnosis present

## 2024-05-07 HISTORY — PX: ESOPHAGOGASTRODUODENOSCOPY: SHX5428

## 2024-05-07 SURGERY — EGD (ESOPHAGOGASTRODUODENOSCOPY)
Anesthesia: General

## 2024-05-07 MED ORDER — LACTATED RINGERS IV SOLN: INTRAVENOUS | Status: DC | PRN

## 2024-05-07 MED ORDER — PROPOFOL 10 MG/ML IV BOLUS
INTRAVENOUS | Status: DC | PRN
  Administered 2024-05-07: 100 mg via INTRAVENOUS

## 2024-05-07 MED ORDER — LIDOCAINE 2% (20 MG/ML) 5 ML SYRINGE
INTRAMUSCULAR | Status: DC | PRN
  Administered 2024-05-07: 50 mg via INTRAVENOUS

## 2024-05-07 NOTE — Transfer of Care (Addendum)
 Immediate Anesthesia Transfer of Care Note  Patient: Kathryn Ramos  Procedure(s) Performed: EGD (ESOPHAGOGASTRODUODENOSCOPY)  Patient Location: Short Stay  Anesthesia Type:General  Level of Consciousness: awake  Airway & Oxygen Therapy: Patient Spontanous Breathing  Post-op Assessment: Report given to RN  Post vital signs: Reviewed and stable  Last Vitals:  Vitals Value Taken Time  BP 107/64   Temp 36.5   Pulse 60   Resp 17   SpO2 97     Last Pain:  Vitals:   05/07/24 1009  TempSrc:   PainSc: 0-No pain      Patients Stated Pain Goal: 5 (05/07/24 0847)  Complications: No notable events documented.

## 2024-05-07 NOTE — Anesthesia Postprocedure Evaluation (Signed)
 Anesthesia Post Note  Patient: Kathryn Ramos  Procedure(s) Performed: EGD (ESOPHAGOGASTRODUODENOSCOPY)  Patient location during evaluation: Short Stay Anesthesia Type: General Level of consciousness: awake and alert Pain management: pain level controlled Vital Signs Assessment: post-procedure vital signs reviewed and stable Respiratory status: spontaneous breathing Cardiovascular status: blood pressure returned to baseline Postop Assessment: no apparent nausea or vomiting Anesthetic complications: no   No notable events documented.   Last Vitals:  Vitals:   05/07/24 0847 05/07/24 1030  BP: (!) 140/68 107/64  Pulse: 60 60  Resp: 16 17  Temp: 36.8 C 36.6 C  SpO2: 98% 97%    Last Pain:  Vitals:   05/07/24 1030  TempSrc: Oral  PainSc: 0-No pain                 Naika Noto

## 2024-05-07 NOTE — Op Note (Signed)
 [facility] Patient Name: Kathryn Ramos Procedure Date: 05/07/2024 9:47 AM MRN: [id number] Date of Birth: 06/03/1956 Attending MD: Samantha Cress , , [id number] CSN: [id number] Age: 68 Admit Type: Outpatient Procedure:                Upper GI endoscopy Indications:              Hepatitis rule out esophageal varices Providers:                Samantha Cress, Graydon Lazier RN, RN, Theola Fitch Referring MD:              Medicines:                Monitored Anesthesia Care Complications:            No immediate complications. Estimated Blood Loss:     Estimated blood loss: none. Procedure:                Pre-Anesthesia Assessment:                           - Prior to the procedure, a History and Physical                            was performed, and patient medications, allergies                            and sensitivities were reviewed. The patient's                            tolerance of previous anesthesia was reviewed.                           - The risks and benefits of the procedure and the                            sedation options and risks were discussed with the                            patient. All questions were answered and informed                            consent was obtained.                           - ASA Grade Assessment: II - A patient with mild                            systemic disease.                           After obtaining informed consent, the endoscope was                            passed under direct vision. Throughout the                            procedure,  the patient's blood pressure, pulse, and                            oxygen saturations were monitored continuously. The                            [id number]-[id number] ([id number]) scope was introduced through the                            mouth, and advanced to the second part of duodenum.                            The upper GI endoscopy was accomplished without                            difficulty.  The patient tolerated the procedure                            well. Scope In: 10:16:00 AM Scope Out: 10:21:08 AM Total Procedure Duration: 0 hours 5 minutes 8 seconds  Findings:      A 1 cm hiatal hernia was present.      A few localized 4 mm erosions with no stigmata of recent bleeding were       found in the gastric antrum. Biopsies were taken with a cold forceps for       Helicobacter pylori testing.      The examined duodenum was normal. Impression:               - 1 cm hiatal hernia.                           - Erosive gastropathy with no stigmata of recent                            bleeding. Biopsied.                           - Normal examined duodenum. Moderate Sedation:      Per Anesthesia Care Recommendation:           - Discharge patient to home (ambulatory).                           - Resume previous diet.                           - Await pathology results. Procedure Code(s):        --- Professional ---                           [phone], Esophagogastroduodenoscopy, flexible,                            transoral; with biopsy, single or multiple Diagnosis Code(s):        --- Professional ---  K44.9, Diaphragmatic hernia without obstruction or                            gangrene                           K31.89, Other diseases of stomach and duodenum                           K75.9, Inflammatory liver disease, unspecified CPT copyright 2022 American Medical Association. All rights reserved. The codes documented in this report are preliminary and upon coder review may  be revised to meet current compliance requirements. Samantha Cress, MD Samantha Cress,  05/07/2024 10:29:32 AM This report has been signed electronically. Number of Addenda: 0

## 2024-05-07 NOTE — Anesthesia Preprocedure Evaluation (Signed)
 Anesthesia Evaluation  Patient identified by MRN, date of birth, ID band Patient awake    Reviewed: Allergy & Precautions, H&P , NPO status , Patient's Chart, lab work & pertinent test results, reviewed documented beta blocker date and time   Airway Mallampati: II  TM Distance: >3 FB Neck ROM: full    Dental no notable dental hx.    Pulmonary neg pulmonary ROS   Pulmonary exam normal breath sounds clear to auscultation       Cardiovascular Exercise Tolerance: Good hypertension, negative cardio ROS  Rhythm:regular Rate:Normal     Neuro/Psych  Headaches negative neurological ROS  negative psych ROS   GI/Hepatic negative GI ROS, Neg liver ROS,,,(+) Hepatitis -  Endo/Other  negative endocrine ROS    Renal/GU negative Renal ROS  negative genitourinary   Musculoskeletal   Abdominal   Peds  Hematology negative hematology ROS (+)   Anesthesia Other Findings   Reproductive/Obstetrics negative OB ROS                             Anesthesia Physical Anesthesia Plan  ASA: 2  Anesthesia Plan: General   Post-op Pain Management:    Induction:   PONV Risk Score and Plan: Propofol  infusion  Airway Management Planned:   Additional Equipment:   Intra-op Plan:   Post-operative Plan:   Informed Consent: I have reviewed the patients History and Physical, chart, labs and discussed the procedure including the risks, benefits and alternatives for the proposed anesthesia with the patient or authorized representative who has indicated his/her understanding and acceptance.     Dental Advisory Given  Plan Discussed with: CRNA  Anesthesia Plan Comments:        Anesthesia Quick Evaluation

## 2024-05-07 NOTE — H&P (Signed)
 Kathryn Ramos is an 68 y.o. female.   Chief Complaint: variceal screening due to concern of liver fibrosis/cirrhosis. HPI: Kathryn Ramos is a 68 y.o. female with medical history of PBC with possible advanced liver fibrosis, osteoarthritis, vaginal lichen sclerosis, polyclonal gammopathy, who presents for variceal screening due to concern of liver fibrosis/cirrhosis.  The patient denies having any nausea, vomiting, fever, chills, hematochezia, melena, hematemesis, abdominal distention, abdominal pain, diarrhea, jaundice, pruritus or weight loss.   Past Medical History:  Diagnosis Date   Degeneration, intervertebral disc    Hypertension    Migraine 12/10/2007   Osteoarthritis    Primary biliary cirrhosis (HCC) 12/09/2001    Past Surgical History:  Procedure Laterality Date   COLONOSCOPY  03/18/2012   Procedure: COLONOSCOPY;  Surgeon: Ruby Corporal, MD;  [facility];  Service: Endoscopy;  Laterality: N/A;  930   COLONOSCOPY WITH PROPOFOL  N/A 05/01/2022   Procedure: COLONOSCOPY WITH PROPOFOL ;  Surgeon: Ruby Corporal, MD;  [facility];  Service: Endoscopy;  Laterality: N/A;  1010   colonscopy  2002   DILATION AND CURETTAGE OF UTERUS  1978   ENDOMETRIAL FULGURATION  2005   of a cyst   ESOPHAGOGASTRODUODENOSCOPY (EGD) WITH PROPOFOL  N/A 05/01/2022   Procedure: ESOPHAGOGASTRODUODENOSCOPY (EGD) WITH PROPOFOL ;  Surgeon: Ruby Corporal, MD;  [facility];  Service: Endoscopy;  Laterality: N/A;   LIVER BIOPSY  2003   TUBAL LIGATION  1995    Family History  Problem Relation Age of Onset   Dementia Mother    Cancer Father    Social History:  reports that she has never smoked. She has never used smokeless tobacco. She reports that she does not drink alcohol  and does not use drugs.  Allergies:  Allergies  Allergen Reactions   Indocin [Indomethacin] Swelling and Rash   Celebrex [Celecoxib]     Loopy messed with stomach - diarrhea   Mobic [Meloxicam]  Other (See Comments)    Loopy/ messed with stomach-diarrhea    Clindamycin/Lincomycin Diarrhea   Levofloxacin Rash   Penicillins Rash    Medications Prior to Admission  Medication Sig Dispense Refill   acetaminophen  (TYLENOL ) 500 MG tablet Take 500 mg by mouth every 6 (six) hours as needed (pain.).     amLODipine (NORVASC) 5 MG tablet Take 5 mg by mouth in the morning.     augmented betamethasone dipropionate (DIPROLENE-AF) 0.05 % cream Apply topically 2 (two) times daily as needed.     Cholecalciferol (VITAMIN D3) 50 MCG (2000 UT) TABS Take 2,000 Units by mouth in the morning.     clobetasol ointment (TEMOVATE) 0.05 % Apply 1 application. topically at bedtime.     diphenhydrAMINE (BENADRYL) 25 mg capsule Take 25 mg by mouth at bedtime as needed for sleep.     diphenhydrAMINE HCl, Sleep, (ZZZQUIL) 25 MG CAPS Take 25 mg by mouth at bedtime as needed (sleep).     fish oil-omega-3 fatty acids 1000 MG capsule Take 1,000 mg by mouth in the morning.     hydrochlorothiazide (HYDRODIURIL) 12.5 MG tablet Take 12.5 mg by mouth in the morning.     metroNIDAZOLE (METROGEL) 0.75 % gel Apply 1 Application topically 2 (two) times daily. As needed     Multiple Vitamin (MULTIVITAMIN WITH MINERALS) TABS tablet Take 1 tablet by mouth in the morning.     ursodiol  (ACTIGALL ) 300 MG capsule TAKE 2 CAPSULES BY MOUTH TWICE A DAY 120 capsule 2   aspirin 81 MG  EC tablet Take 81 mg by mouth in the morning.      No results found for this or any previous visit (from the past 48 hours). No results found.  Review of Systems  All other systems reviewed and are negative.   Blood pressure (!) 140/68, pulse 60, temperature 98.3 F (36.8 C), temperature source Oral, resp. rate 16, height 5\' 6"  (1.676 m), weight 83.9 kg, SpO2 98%. Physical Exam  GENERAL: The patient is AO x3, in no acute distress. HEENT: Head is normocephalic and atraumatic. EOMI are intact. Mouth is well hydrated and without lesions. NECK: Supple.  No masses LUNGS: Clear to auscultation. No presence of rhonchi/wheezing/rales. Adequate chest expansion HEART: RRR, normal s1 and s2. ABDOMEN: Soft, nontender, no guarding, no peritoneal signs, and nondistended. BS +. No masses. EXTREMITIES: Without any cyanosis, clubbing, rash, lesions or edema. NEUROLOGIC: AOx3, no focal motor deficit. SKIN: no jaundice, no rashes  Assessment/Plan Kathryn Ramos is a 68 y.o. female with medical history of PBC with possible advanced liver fibrosis, osteoarthritis, vaginal lichen sclerosis, polyclonal gammopathy, who presents for variceal screening due to concern of liver fibrosis/cirrhosis.  Will proceed with EGD.  Urban Garden, MD 05/07/2024, 9:51 AM

## 2024-05-07 NOTE — Discharge Instructions (Signed)
You are being discharged to home.  Resume your previous diet.  We are waiting for your pathology results.  

## 2024-05-10 ENCOUNTER — Ambulatory Visit (INDEPENDENT_AMBULATORY_CARE_PROVIDER_SITE_OTHER): Payer: Self-pay | Admitting: Gastroenterology

## 2024-05-10 ENCOUNTER — Inpatient Hospital Stay: Payer: Medicare HMO | Attending: Physician Assistant

## 2024-05-10 DIAGNOSIS — R768 Other specified abnormal immunological findings in serum: Secondary | ICD-10-CM | POA: Insufficient documentation

## 2024-05-10 DIAGNOSIS — M899 Disorder of bone, unspecified: Secondary | ICD-10-CM | POA: Insufficient documentation

## 2024-05-10 DIAGNOSIS — D890 Polyclonal hypergammaglobulinemia: Secondary | ICD-10-CM

## 2024-05-10 LAB — CBC WITH DIFFERENTIAL/PLATELET
Abs Immature Granulocytes: 0.01 10*3/uL (ref 0.00–0.07)
Basophils Absolute: 0 10*3/uL (ref 0.0–0.1)
Basophils Relative: 1 %
Eosinophils Absolute: 0.1 10*3/uL (ref 0.0–0.5)
Eosinophils Relative: 3 %
HCT: 42.4 % (ref 36.0–46.0)
Hemoglobin: 13.1 g/dL (ref 12.0–15.0)
Immature Granulocytes: 0 %
Lymphocytes Relative: 24 %
Lymphs Abs: 0.9 10*3/uL (ref 0.7–4.0)
MCH: 29.4 pg (ref 26.0–34.0)
MCHC: 30.9 g/dL (ref 30.0–36.0)
MCV: 95.1 fL (ref 80.0–100.0)
Monocytes Absolute: 0.3 10*3/uL (ref 0.1–1.0)
Monocytes Relative: 7 %
Neutro Abs: 2.4 10*3/uL (ref 1.7–7.7)
Neutrophils Relative %: 65 %
Platelets: 171 10*3/uL (ref 150–400)
RBC: 4.46 MIL/uL (ref 3.87–5.11)
RDW: 12.6 % (ref 11.5–15.5)
WBC: 3.7 10*3/uL — ABNORMAL LOW (ref 4.0–10.5)
nRBC: 0 % (ref 0.0–0.2)

## 2024-05-10 LAB — COMPREHENSIVE METABOLIC PANEL WITH GFR
ALT: 23 U/L (ref 0–44)
AST: 32 U/L (ref 15–41)
Albumin: 3.8 g/dL (ref 3.5–5.0)
Alkaline Phosphatase: 140 U/L — ABNORMAL HIGH (ref 38–126)
Anion gap: 11 (ref 5–15)
BUN: 17 mg/dL (ref 8–23)
CO2: 23 mmol/L (ref 22–32)
Calcium: 9.6 mg/dL (ref 8.9–10.3)
Chloride: 104 mmol/L (ref 98–111)
Creatinine, Ser: 0.77 mg/dL (ref 0.44–1.00)
GFR, Estimated: >60 mL/min (ref >60)
Glucose, Bld: 104 mg/dL — ABNORMAL HIGH (ref 70–99)
Potassium: 3.5 mmol/L (ref 3.5–5.1)
Sodium: 138 mmol/L (ref 135–145)
Total Bilirubin: 0.5 mg/dL (ref 0.0–1.2)
Total Protein: 7.9 g/dL (ref 6.5–8.1)

## 2024-05-10 LAB — SURGICAL PATHOLOGY

## 2024-05-10 LAB — LACTATE DEHYDROGENASE: LDH: 159 U/L (ref 98–192)

## 2024-05-11 ENCOUNTER — Encounter (HOSPITAL_COMMUNITY): Payer: Self-pay | Admitting: Gastroenterology

## 2024-05-11 LAB — KAPPA/LAMBDA LIGHT CHAINS
Kappa free light chain: 31.8 mg/L — ABNORMAL HIGH (ref 3.3–19.4)
Kappa, lambda light chain ratio: 1.43 (ref 0.26–1.65)
Lambda free light chains: 22.3 mg/L (ref 5.7–26.3)

## 2024-05-11 LAB — PROTEIN ELECTROPHORESIS, SERUM
A/G Ratio: 0.9 (ref 0.7–1.7)
Albumin ELP: 3.6 g/dL (ref 2.9–4.4)
Alpha-1-Globulin: 0.2 g/dL (ref 0.0–0.4)
Alpha-2-Globulin: 0.8 g/dL (ref 0.4–1.0)
Beta Globulin: 1.1 g/dL (ref 0.7–1.3)
Gamma Globulin: 1.7 g/dL (ref 0.4–1.8)
Globulin, Total: 3.8 g/dL (ref 2.2–3.9)
Total Protein ELP: 7.4 g/dL (ref 6.0–8.5)

## 2024-05-12 LAB — IMMUNOFIXATION ELECTROPHORESIS
IgA: 141 mg/dL (ref 87–352)
IgG (Immunoglobin G), Serum: 1643 mg/dL — ABNORMAL HIGH (ref 586–1602)
IgM (Immunoglobulin M), Srm: 329 mg/dL — ABNORMAL HIGH (ref 26–217)
Total Protein ELP: 7.2 g/dL (ref 6.0–8.5)

## 2024-05-17 ENCOUNTER — Inpatient Hospital Stay: Payer: Medicare HMO | Admitting: Oncology

## 2024-05-17 ENCOUNTER — Encounter (INDEPENDENT_AMBULATORY_CARE_PROVIDER_SITE_OTHER): Payer: Self-pay | Admitting: *Deleted

## 2024-05-17 VITALS — BP 141/82 | HR 82 | Temp 97.8°F | Resp 20 | Wt 191.2 lb

## 2024-05-17 DIAGNOSIS — R768 Other specified abnormal immunological findings in serum: Secondary | ICD-10-CM | POA: Diagnosis not present

## 2024-05-17 DIAGNOSIS — M899 Disorder of bone, unspecified: Secondary | ICD-10-CM | POA: Diagnosis not present
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
## 2024-05-17 NOTE — Assessment & Plan Note (Signed)
 Patient has slightly elevated free light chains with a normal ratio.  Likely secondary to chronic inflammation from PBC IFE consistent with polyclonal increase  -No further workup needed at this time - Continue to monitor

## 2024-05-19 ENCOUNTER — Other Ambulatory Visit (INDEPENDENT_AMBULATORY_CARE_PROVIDER_SITE_OTHER): Payer: Self-pay | Admitting: Gastroenterology

## 2024-05-19 DIAGNOSIS — K743 Primary biliary cirrhosis: Secondary | ICD-10-CM

## 2024-05-27 ENCOUNTER — Other Ambulatory Visit (HOSPITAL_COMMUNITY)

## 2024-05-31 DIAGNOSIS — N3001 Acute cystitis with hematuria: Secondary | ICD-10-CM | POA: Diagnosis not present

## 2024-05-31 DIAGNOSIS — R3989 Other symptoms and signs involving the genitourinary system: Secondary | ICD-10-CM | POA: Diagnosis not present

## 2024-05-31 DIAGNOSIS — R319 Hematuria, unspecified: Secondary | ICD-10-CM | POA: Diagnosis not present

## 2024-06-01 ENCOUNTER — Telehealth: Payer: Self-pay | Admitting: *Deleted

## 2024-06-01 DIAGNOSIS — K743 Primary biliary cirrhosis: Secondary | ICD-10-CM

## 2024-06-01 NOTE — Telephone Encounter (Signed)
Patient called back and is aware of appt details 

## 2024-06-01 NOTE — Telephone Encounter (Signed)
 Pt called in. Received letter she is due for 6 month US .   Called CS and scheduled for 7/1, arrival 9:15am, npo midnight prior

## 2024-06-01 NOTE — Telephone Encounter (Signed)
 09:04 am Called patient and spoke with her pertaining to message left for Dr. Davonna about PET scan scheduled for Thursday. Message received from Dr. Davonna , may take medication for bladder infection and have PET scan performed on Thursday. Patient verbalized an understanding.

## 2024-06-02 ENCOUNTER — Ambulatory Visit: Admitting: Oncology

## 2024-06-03 ENCOUNTER — Encounter (HOSPITAL_COMMUNITY)
Admission: RE | Admit: 2024-06-03 | Discharge: 2024-06-03 | Disposition: A | Discharge status: Home / Self Care | Source: Ambulatory Visit | Attending: Oncology | Admitting: Oncology

## 2024-06-03 DIAGNOSIS — Q279 Congenital malformation of peripheral vascular system, unspecified: Secondary | ICD-10-CM | POA: Diagnosis not present

## 2024-06-03 DIAGNOSIS — M899 Disorder of bone, unspecified: Secondary | ICD-10-CM | POA: Insufficient documentation

## 2024-06-03 DIAGNOSIS — C9000 Multiple myeloma not having achieved remission: Secondary | ICD-10-CM | POA: Diagnosis not present

## 2024-06-03 DIAGNOSIS — K5730 Diverticulosis of large intestine without perforation or abscess without bleeding: Secondary | ICD-10-CM | POA: Insufficient documentation

## 2024-06-03 DIAGNOSIS — K769 Liver disease, unspecified: Secondary | ICD-10-CM | POA: Diagnosis not present

## 2024-06-03 DIAGNOSIS — Q433 Congenital malformations of intestinal fixation: Secondary | ICD-10-CM | POA: Diagnosis not present

## 2024-06-03 MED ORDER — FLUDEOXYGLUCOSE F - 18 (FDG) INJECTION
9.5900 | Freq: Once | INTRAVENOUS | Status: AC | PRN
  Administered 2024-06-03: 9.59 via INTRAVENOUS

## 2024-06-08 ENCOUNTER — Ambulatory Visit (INDEPENDENT_AMBULATORY_CARE_PROVIDER_SITE_OTHER): Payer: Self-pay | Admitting: Gastroenterology

## 2024-06-08 ENCOUNTER — Ambulatory Visit (HOSPITAL_COMMUNITY)
Admission: RE | Admit: 2024-06-08 | Discharge: 2024-06-08 | Disposition: A | Discharge status: Home / Self Care | Source: Ambulatory Visit | Attending: Gastroenterology | Admitting: Gastroenterology

## 2024-06-08 DIAGNOSIS — K743 Primary biliary cirrhosis: Secondary | ICD-10-CM | POA: Insufficient documentation

## 2024-06-08 NOTE — Progress Notes (Signed)
6 mth US noted in recall 

## 2024-06-16 ENCOUNTER — Inpatient Hospital Stay: Attending: Physician Assistant | Admitting: Oncology

## 2024-06-16 ENCOUNTER — Inpatient Hospital Stay: Admitting: Oncology

## 2024-06-16 VITALS — BP 138/69 | HR 78 | Temp 99.0°F | Resp 18 | Ht 65.5 in | Wt 191.0 lb

## 2024-06-16 DIAGNOSIS — R768 Other specified abnormal immunological findings in serum: Secondary | ICD-10-CM | POA: Insufficient documentation

## 2024-06-16 DIAGNOSIS — M899 Disorder of bone, unspecified: Secondary | ICD-10-CM | POA: Diagnosis not present

## 2024-06-16 NOTE — Assessment & Plan Note (Signed)
 Patient has slightly elevated free light chains with a normal ratio.  Likely secondary to chronic inflammation from PBC IFE consistent with polyclonal increase  - No further workup needed at this time
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
## 2024-09-08 DIAGNOSIS — L308 Other specified dermatitis: Secondary | ICD-10-CM | POA: Diagnosis not present

## 2024-09-13 DIAGNOSIS — L308 Other specified dermatitis: Secondary | ICD-10-CM | POA: Diagnosis not present

## 2024-09-14 DIAGNOSIS — L900 Lichen sclerosus et atrophicus: Secondary | ICD-10-CM | POA: Diagnosis not present

## 2024-09-14 DIAGNOSIS — N958 Other specified menopausal and perimenopausal disorders: Secondary | ICD-10-CM | POA: Diagnosis not present

## 2024-10-13 ENCOUNTER — Encounter (INDEPENDENT_AMBULATORY_CARE_PROVIDER_SITE_OTHER): Payer: Self-pay | Admitting: Gastroenterology

## 2024-10-22 ENCOUNTER — Other Ambulatory Visit (INDEPENDENT_AMBULATORY_CARE_PROVIDER_SITE_OTHER): Payer: Self-pay | Admitting: Gastroenterology

## 2024-10-22 DIAGNOSIS — K743 Primary biliary cirrhosis: Secondary | ICD-10-CM

## 2024-10-27 ENCOUNTER — Encounter (INDEPENDENT_AMBULATORY_CARE_PROVIDER_SITE_OTHER): Payer: Self-pay | Admitting: *Deleted

## 2024-11-18 ENCOUNTER — Telehealth (INDEPENDENT_AMBULATORY_CARE_PROVIDER_SITE_OTHER): Payer: Self-pay

## 2024-11-18 ENCOUNTER — Other Ambulatory Visit (INDEPENDENT_AMBULATORY_CARE_PROVIDER_SITE_OTHER): Payer: Self-pay

## 2024-11-18 DIAGNOSIS — K7400 Hepatic fibrosis, unspecified: Secondary | ICD-10-CM

## 2024-11-18 DIAGNOSIS — K743 Primary biliary cirrhosis: Secondary | ICD-10-CM
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
## 2024-11-22 DIAGNOSIS — R682 Dry mouth, unspecified: Secondary | ICD-10-CM | POA: Diagnosis not present

## 2024-11-22 DIAGNOSIS — M109 Gout, unspecified: Secondary | ICD-10-CM | POA: Diagnosis not present

## 2024-11-22 DIAGNOSIS — R21 Rash and other nonspecific skin eruption: Secondary | ICD-10-CM | POA: Diagnosis not present

## 2024-11-22 DIAGNOSIS — R7689 Other specified abnormal immunological findings in serum: Secondary | ICD-10-CM | POA: Diagnosis not present

## 2024-11-22 DIAGNOSIS — Z6831 Body mass index (BMI) 31.0-31.9, adult: Secondary | ICD-10-CM | POA: Diagnosis not present

## 2024-11-22 DIAGNOSIS — H04123 Dry eye syndrome of bilateral lacrimal glands: Secondary | ICD-10-CM | POA: Diagnosis not present

## 2024-11-22 DIAGNOSIS — K141 Geographic tongue: Secondary | ICD-10-CM | POA: Diagnosis not present

## 2024-11-22 DIAGNOSIS — E669 Obesity, unspecified: Secondary | ICD-10-CM | POA: Diagnosis not present

## 2024-11-24 DIAGNOSIS — D225 Melanocytic nevi of trunk: Secondary | ICD-10-CM | POA: Diagnosis not present

## 2024-11-24 DIAGNOSIS — Z1283 Encounter for screening for malignant neoplasm of skin: Secondary | ICD-10-CM | POA: Diagnosis not present

## 2024-11-24 DIAGNOSIS — L718 Other rosacea: Secondary | ICD-10-CM | POA: Diagnosis not present

## 2024-11-25 DIAGNOSIS — Z1231 Encounter for screening mammogram for malignant neoplasm of breast: Secondary | ICD-10-CM | POA: Diagnosis not present

## 2024-12-06 ENCOUNTER — Ambulatory Visit (HOSPITAL_COMMUNITY)
Admission: RE | Admit: 2024-12-06 | Discharge: 2024-12-06 | Disposition: A | Discharge status: Home / Self Care | Source: Ambulatory Visit | Attending: Gastroenterology | Admitting: Gastroenterology

## 2024-12-06 DIAGNOSIS — K7400 Hepatic fibrosis, unspecified: Secondary | ICD-10-CM | POA: Insufficient documentation

## 2024-12-06 DIAGNOSIS — K743 Primary biliary cirrhosis: Secondary | ICD-10-CM | POA: Diagnosis present

## 2024-12-23 ENCOUNTER — Ambulatory Visit: Payer: Self-pay | Admitting: Gastroenterology

## 2025-01-03 ENCOUNTER — Ambulatory Visit (INDEPENDENT_AMBULATORY_CARE_PROVIDER_SITE_OTHER): Admitting: Gastroenterology

## 2025-01-05 ENCOUNTER — Encounter (INDEPENDENT_AMBULATORY_CARE_PROVIDER_SITE_OTHER): Payer: Self-pay | Admitting: Gastroenterology

## 2025-01-05 ENCOUNTER — Ambulatory Visit (INDEPENDENT_AMBULATORY_CARE_PROVIDER_SITE_OTHER): Admitting: Gastroenterology

## 2025-01-05 VITALS — BP 131/78 | HR 69 | Temp 97.2°F | Ht 66.0 in | Wt 191.3 lb

## 2025-01-05 DIAGNOSIS — K743 Primary biliary cirrhosis: Secondary | ICD-10-CM | POA: Diagnosis not present

## 2025-01-05 DIAGNOSIS — K7460 Unspecified cirrhosis of liver: Secondary | ICD-10-CM

## 2025-01-05 MED ORDER — URSODIOL 300 MG PO CAPS: 600.0000 mg | ORAL_CAPSULE | Freq: Two times a day (BID) | ORAL | 3 refills | Status: AC

## 2025-01-05 NOTE — Progress Notes (Signed)
 8 Adaline Trejos Faizan Geneva Pallas , M.D. Gastroenterology & Hepatology [facility] Gastroenterology [address] [place], [place] [address] Primary Care Physician: Lori Thedora BROCKS [address] [place] [address]  History of Present Illness:  KAMYAH WILHELMSEN is a 69 y.o. female PBC with possible advanced liver fibrosis, osteoarthritis, vaginal lichen sclerosis, polyclonal gammopathy, who presents for follow up of PBC complicated with cirrhosis  Patient was last seen by Dr. Eartha 12/2023 and before that with Dr. golda and now switching care in their  absence.  She was diagnosed with primary biliary cholangitis in 2003. She has been maintained Urso  ever since.   At that time, the patient was continued on URSO  600 mg twice daily.  She was also advised to start taking Benefiber for constipation.  Patient reports that she takes ursodiol  mostly once a day and forgets taking it at night day .patient has been has been taking up to 2g of Tylenol  per day.  Denies any yellowing of the skin and generalized itching   Most recent labs from 09/2024 showed a CMP with alkaline phosphatase 182, total bilirubin 0.5, AST 29 ALT 20 normal renal function,  CBC with WBC 3.6, hemoglobin 13.8 tubular and platelets 204.  The patient denies having any nausea, vomiting, fever, chills, hematochezia, melena, hematemesis, abdominal distention, abdominal pain, diarrhea, jaundice, pruritus or weight loss.   Patient reports she had a DEXA on 12/24 - states it was normal but no report is available. Lipid panel was checked on 09/09/2023, total cholesterol was 172, triglycerides 80, LDL 96, vitamin D  was 52.   Last Colonoscopy: 04/2022 Diverticulosis   Last EGD: 04/2024  - 1 cm hiatal hernia. - Erosive gastropathy with no stigmata of recent bleeding. Biopsied. - Normal examined duodenum.   04/2022 - Normal esophagus. - Z- line regular, 35 cm from the incisors. - 2 cm hiatal hernia. - Normal  stomach. - Normal duodenal bulb and second portion of the duodenum.  Past Medical History: Past Medical History:  Diagnosis Date   Degeneration, intervertebral disc    Hypertension    Migraine 12/10/2007   Osteoarthritis    Primary biliary cirrhosis (HCC) 12/09/2001    Past Surgical History: Past Surgical History:  Procedure Laterality Date   COLONOSCOPY  03/18/2012   Procedure: COLONOSCOPY;  Surgeon: Claudis RAYMOND Golda, MD;  [facility];  Service: Endoscopy;  Laterality: N/A;  930   COLONOSCOPY WITH PROPOFOL  N/A 05/01/2022   Procedure: COLONOSCOPY WITH PROPOFOL ;  Surgeon: Golda Claudis RAYMOND, MD;  Location: [facility];  Service: Endoscopy;  Laterality: N/A;  1010   colonscopy  2002   DILATION AND CURETTAGE OF UTERUS  1978   ENDOMETRIAL FULGURATION  2005   of a cyst   ESOPHAGOGASTRODUODENOSCOPY N/A 05/07/2024   Procedure: EGD (ESOPHAGOGASTRODUODENOSCOPY);  Surgeon: Eartha Flavors, Toribio, MD;  Location: [facility];  Service: Gastroenterology;  Laterality: N/A;  9:45AM;ASA 1   ESOPHAGOGASTRODUODENOSCOPY (EGD) WITH PROPOFOL  N/A 05/01/2022   Procedure: ESOPHAGOGASTRODUODENOSCOPY (EGD) WITH PROPOFOL ;  Surgeon: Golda Claudis RAYMOND, MD;  Location: [facility];  Service: Endoscopy;  Laterality: N/A;   LIVER BIOPSY  2003   TUBAL LIGATION  1995    Family History: Family History  Problem Relation Age of Onset   Dementia Mother    Cancer Father     Social History:Tobacco Use History[1] Social History   Substance and Sexual Activity  Alcohol  Use No   Social History   Substance and Sexual Activity  Drug Use  No    Allergies: Allergies[2]  Medications: Current Outpatient Medications  Medication Sig Dispense Refill   acetaminophen  (TYLENOL ) 500 MG tablet Take 500 mg by mouth every 6 (six) hours as needed (pain.). (Patient taking differently: Take 500 mg by mouth every 6 (six) hours as needed (pain.). 1 650 mg a day  500 mg prn)     amLODipine (NORVASC) 5 MG tablet  Take 5 mg by mouth in the morning.     ascorbic acid (VITAMIN C) 100 MG tablet Take 100 mg by mouth daily.     aspirin 81 MG EC tablet Take 81 mg by mouth in the morning.     augmented betamethasone dipropionate (DIPROLENE-AF) 0.05 % cream Apply topically 2 (two) times daily as needed.     Carboxymethylcellulose Sodium (EYE DROPS OP) Apply to eye.     Cholecalciferol (VITAMIN D3) 50 MCG (2000 UT) TABS Take 2,000 Units by mouth in the morning.     clobetasol ointment (TEMOVATE) 0.05 % Apply 1 application. topically at bedtime. (Patient taking differently: Apply 1 application  topically at bedtime. Twice a week)     desonide (DESOWEN) 0.05 % ointment Apply topically 2 (two) times daily as needed.     diphenhydrAMINE (BENADRYL) 25 mg capsule Take 25 mg by mouth at bedtime as needed for sleep.     diphenhydrAMINE HCl, Sleep, (ZZZQUIL) 25 MG CAPS Take 25 mg by mouth at bedtime as needed (sleep).     docusate sodium (COLACE) 50 MG capsule Take 50 mg by mouth daily as needed for mild constipation.     fish oil-omega-3 fatty acids 1000 MG capsule Take 1,000 mg by mouth in the morning.     halobetasol (ULTRAVATE) 0.05 % cream Apply topically 2 (two) times daily as needed.     hydrochlorothiazide (HYDRODIURIL) 12.5 MG tablet Take 12.5 mg by mouth in the morning.     loratadine (CLARITIN) 10 MG tablet 1 tablet Orally Once a day     metroNIDAZOLE (METROGEL) 0.75 % gel Apply 1 Application topically 2 (two) times daily. As needed     Multiple Vitamin (MULTIVITAMIN WITH MINERALS) TABS tablet Take 1 tablet by mouth in the morning.     polyethylene glycol powder (GLYCOLAX/MIRALAX) 17 GM/SCOOP powder Take 119 g by mouth daily. As needed     sodium chloride  (OCEAN) 0.65 % nasal spray as directed Nasally     ursodiol  (ACTIGALL ) 300 MG capsule TAKE 2 CAPSULES BY MOUTH TWICE A DAY 100 capsule 3   No current facility-administered medications for this visit.    Review of Systems: GENERAL: negative for malaise,  night sweats HEENT: No changes in hearing or vision, no nose bleeds or other nasal problems. NECK: Negative for lumps, goiter, pain and significant neck swelling RESPIRATORY: Negative for cough, wheezing CARDIOVASCULAR: Negative for chest pain, leg swelling, palpitations, orthopnea GI: SEE HPI MUSCULOSKELETAL: Negative for joint pain or swelling, back pain, and muscle pain. SKIN: Negative for lesions, rash HEMATOLOGY Negative for prolonged bleeding, bruising easily, and swollen nodes. ENDOCRINE: Negative for cold or heat intolerance, polyuria, polydipsia and goiter. NEURO: negative for tremor, gait imbalance, syncope and seizures. The remainder of the review of systems is noncontributory.   Physical Exam: There were no vitals taken for this visit. GENERAL: The patient is AO x3, in no acute distress. HEENT: Head is normocephalic and atraumatic. EOMI are intact. Mouth is well hydrated and without lesions. NECK: Supple. No masses LUNGS: Clear to auscultation. No presence of rhonchi/wheezing/rales. Adequate chest expansion  HEART: RRR, normal s1 and s2. ABDOMEN: Soft, nontender, no guarding, no peritoneal signs, and nondistended. BS +. No masses.   Imaging/Labs: as above     Latest Ref Rng & Units 05/10/2024    2:29 PM 10/27/2023    2:41 PM 10/05/2023    6:54 PM  CBC  WBC 4.0 - 10.5 K/uL 3.7  4.1    Hemoglobin 12.0 - 15.0 g/dL 86.8  87.2  87.0   Hematocrit 36.0 - 46.0 % 42.4  39.7  38.0   Platelets 150 - 400 K/uL 171  204     No results found for: IRON, TIBC, FERRITIN  I personally reviewed and interpreted the available labs, imaging and endoscopic files.  DEXA scan performed on 12/05/2023, which showed changes consistent with osteopenia (T-score -2.4).   Ultrasound 11/2024 IMPRESSION: Cirrhotic liver morphology. No focal hepatic lesion identified.    Impression and Plan:  VERNEL DONLAN is a 69 y.o. female PBC with possible advanced liver fibrosis, osteoarthritis,  vaginal lichen sclerosis, polyclonal gammopathy, who presents for follow up of PBC complicated with cirrhosis  #Compensated Cirrhosis #PBC  Unable to calculate MELD as there is no INR in our file  Continue #Eitology : To be secondary to PBC, although I do not see any recent viral hepatitis profile which I would repeat  # Hepatic encephalopathy On exam - Avoid opiates or benzodiazepines   # Ascites None on exam and last imaging  # Esophageal varices - Last EGD 2025   # HCC screening - Last abdominal imaging 11/2024 negative - check AFP  Recently elevated alk phos I assume is due to noncompliance to Ursodiol  as patient is not taking maximum recommended dose which is 13 to 15 mg/kg, only taking 600 mg once a day  Recommendation   - Check CBC, MELD labs and AFP - Schedule liver US  in 6 months -Check FibroScan now - Reduce salt intake to <2 g per day - Can take Tylenol  max of 2 g per day (650 mg q8h) for pain - Avoid NSAIDs for pain - Avoid eating raw oysters/shellfish - Protein shake (Ensure or Boost) every night before going to sleep-  -DEXA scan in 2 years; osteopenia: 1,000 to 1,500 mg of calcium and 1,000 International Units of vitamin D  daily in the diet and as supplements if needed. -TSH annually - Continue ursodiol  600 mg twice daily, if after 1 year of adequate URSO , alk phos remains elevated may need to add second line therapy ie: Elafibrinor etc   All questions were answered.      Simpson Paulos Faizan Cleto Claggett, MD Gastroenterology and Hepatology [facility] Gastroenterology   This chart has been completed using [facility] Dictation software, and while attempts have been made to ensure accuracy , certain words and phrases may not be transcribed as intended      [1]  Social History Tobacco Use  Smoking Status Never  Smokeless Tobacco Never  [2]  Allergies Allergen Reactions   Indocin [Indomethacin] Swelling and Rash   Celebrex [Celecoxib]      Loopy messed with stomach - diarrhea   Mobic [Meloxicam] Other (See Comments)    Loopy/ messed with stomach-diarrhea    Clindamycin/Lincomycin Diarrhea   Nsaids     Can not have Voltaren or NSAIDs due to gastritis    Cephalexin Rash   Levofloxacin Rash   Penicillins Rash
